# Patient Record
Sex: Female | Born: 1955 | Race: White | Hispanic: No | Marital: Married | Smoking: Current every day smoker
Health system: Southern US, Community
[De-identification: ages and names within clinical notes are randomized; demographics above are authoritative.]

## PROBLEM LIST (undated history)

## (undated) DIAGNOSIS — J45909 Unspecified asthma, uncomplicated: Secondary | ICD-10-CM

## (undated) DIAGNOSIS — J449 Chronic obstructive pulmonary disease, unspecified: Secondary | ICD-10-CM

## (undated) DIAGNOSIS — M797 Fibromyalgia: Secondary | ICD-10-CM

## (undated) DIAGNOSIS — M1990 Unspecified osteoarthritis, unspecified site: Secondary | ICD-10-CM

## (undated) DIAGNOSIS — IMO0001 Reserved for inherently not codable concepts without codable children: Secondary | ICD-10-CM

## (undated) DIAGNOSIS — J189 Pneumonia, unspecified organism: Secondary | ICD-10-CM

## (undated) HISTORY — PX: ABDOMINAL HYSTERECTOMY: SHX81

## (undated) HISTORY — PX: OTHER SURGICAL HISTORY: SHX169

## (undated) HISTORY — PX: CHOLECYSTECTOMY: SHX55

## (undated) HISTORY — PX: APPENDECTOMY: SHX54

---

## 2007-03-08 ENCOUNTER — Inpatient Hospital Stay (HOSPITAL_COMMUNITY): Admission: RE | Admit: 2007-03-08 | Discharge: 2007-03-11 | Payer: Self-pay | Admitting: Neurosurgery

## 2010-12-06 NOTE — Op Note (Signed)
NAMEKARIGAN, CLONINGER                ACCOUNT NO.:  [id number]   MEDICAL RECORD NO.:  [id number]          PATIENT TYPE:  INP   LOCATION:  3172                         FACILITY:  [facility]   PHYSICIAN:  Coletta Memos, M.D.     DATE OF BIRTH:  [birth year]   DATE OF PROCEDURE:  03/08/2007  DATE OF DISCHARGE:                               OPERATIVE REPORT   PREOPERATIVE DIAGNOSES:  1. Lumbar stenosis L4-5.  2. Lumbar anterolisthesis L4 and L5.  3. Lumbar radiculopathy.   POSTOPERATIVE DIAGNOSES:  1. Lumbar stenosis L4-5.  2. Lumbar anterolisthesis L4 and L5.  3. Lumbar radiculopathy.   PROCEDURE:  1. Posterior lumbar interbody arthrodesis L4-5.  2. Decompression L4-L5 nerve roots.  3. Pedicle screw fixation L4-5.   SURGEON:  Coletta Memos, M.D.   ASSISTANT:  Clydene Fake, M.D.   INDICATIONS:  Stephanie Chen is a 55 year old who presents with severe  pain in the back and lower extremities, especially on the left side.  She underwent epidural injections without success.  Secondary to the  listhesis, facette arthropathy and stenosis, I felt that after I removed  enough bone to make a difference that she would need a fusion.  She is  admitted today for that procedure.   OPERATIVE NOTE:  Ms. Tamura was brought to the operating room.  She had  a Foley catheter placed under sterile conditions after a general  anesthetic was instituted.  She was intubated prior to that without  difficulty.  She was rolled prone onto a Jackson table, and all pressure  points were properly padded.  Her back was prepped, and she was draped  in a sterile fashion.  I infiltrated 30 mL 0.5% lidocaine 1:200,000  strength epinephrine into the lumbar region.  I opened the skin with a  #10 blade, and I took this down to the dorsal thoracolumbar fascia.  I  then exposed the lamina of L3, L4 and L5 bilaterally.  X-ray showed that  I was in the correct interlaminar position.  I placed self-retaining  retractors and then  proceeded with the decompression.   I decompressed the L4 and L5 nerve roots by doing a laminectomy of L4.  She had a great deal of redundant ligamentum flavum and was quite tight  at this level.  I removed the ligament and decompressed using Kerrison  punch bilaterally.  I also used a high-speed drill to perform the  laminectomy of L4.  After thoroughly decompressing both L4 and L5 nerve  roots, I turned my attention towards the interbody arthrodesis.   I opened the disk space bilaterally using a #15 blade.  I then proceeded  with the diskectomy and to prepare the endplates for arthrodesis at that  level using a number of cutting tools, curettes, Kerrison punches and  pituitary rongeurs.  After preparing the disk space, I then sized two 13-  mm cages.  I packed those with morselized autograft and then placed  these into the disk space, one on the left and one on the right side  without difficulty.  I then proceeded to the pedicle  screw fixation.  I  with fluoroscopic guidance placed four screws, two in L4, two in L5,  first using a pedicle probe, then a tap, then the screws.  Each entry  and exit from the hole I checked to make sure that there were no  cutouts, and I appreciated none.  X-ray showed the screws were in good  position.  After that, I then placed the rods and locking caps without  difficulty.   I placed more bone into the disk space around the cages.   I then closed with the wound with Dr. Doreen Beam assistance using Vicryl  sutures to reapproximate the thoracolumbar fascia, subcutaneous  subcuticular layers.  Dermabond was used for sterile dressing.           ______________________________  Coletta Memos, M.D.     KC/MEDQ  D:  03/08/2007  T:  03/10/2007  Job:  [id number]

## 2010-12-09 NOTE — Discharge Summary (Signed)
Stephanie Chen, HAPP NO.:  [id number]   MEDICAL RECORD NO.:  [id number]          PATIENT TYPE:  INP   LOCATION:  5155                         FACILITY:  [facility]   PHYSICIAN:  Clydene Fake, M.D.  DATE OF BIRTH:  21-Aug-1955   DATE OF ADMISSION:  03/08/2007  DATE OF DISCHARGE:  03/11/2007                               DISCHARGE SUMMARY   DIAGNOSES:  1. Lumbar stenosis  2. Central listhesis  3. Radiculopathy.   DISCHARGE DIAGNOSES:  1. Lumbar stenosis.  2. Central listhesis  3. Radiculopathy.   PROCEDURE:  Posterior lumbar decompression and fusion with PLIF with  interbody cages with pedicle screw excision.   REASON FOR ADMISSION:  The patient is a 55 year old woman with severe  pain to her back and lower extremities worse on the left.  Work up  showed central listhesis and stenosis.  Epidural injections did not give  lasting relief and the patient is admitted for decompression and fusion.   HOSPITAL COURSE:  The patient admitted on the day of surgery and  underwent procedure without complications.  Postoperatively the patient  is transferred to the recovery room and then to the floor.  The first  day she was 16th up with much less leg pain.  The incision was clean,  dry and intact.  She was doing well in her increased activity.  She  continued making progress the following day.  She was up ambulating.  We  discontinued the Foley and switched to p.o. pain medicine.  She  continued making good progress with much less leg pain.  She was  ambulating well and was discharged home in stable condition on March 11, 2007.  She was sent home with her usual home medicines plus Percocet  and Flexeril on a p.r.n. basis.  Activity:  No stress activity.  No  special incision care needed, just observe and followup with Dr. Franky Macho  in 2 to 3 weeks.           ______________________________  Clydene Fake, M.D.     JRH/MEDQ  D:  04/11/2007  T:  04/11/2007  Job:   [id number]

## 2011-05-08 LAB — BASIC METABOLIC PANEL
CO2: 30
Chloride: 107
GFR calc Af Amer: >60
Glucose, Bld: 97
Potassium: 4.1
Sodium: 143

## 2011-05-08 LAB — CBC
HCT: 38
Hemoglobin: 12.9
MCHC: 33.8
MCV: 96.3
RBC: 3.95
RDW: 13.4

## 2011-05-08 LAB — TYPE AND SCREEN: Antibody Screen: NEGATIVE

## 2014-05-28 ENCOUNTER — Other Ambulatory Visit (HOSPITAL_COMMUNITY): Payer: Self-pay | Admitting: Neurosurgery

## 2014-06-05 ENCOUNTER — Encounter (HOSPITAL_COMMUNITY)
Admission: RE | Admit: 2014-06-05 | Discharge: 2014-06-05 | Disposition: A | Discharge status: Home / Self Care | Payer: Commercial Managed Care - PPO | Source: Ambulatory Visit | Attending: Neurosurgery | Admitting: Neurosurgery

## 2014-06-05 ENCOUNTER — Encounter (HOSPITAL_COMMUNITY): Payer: Self-pay

## 2014-06-05 DIAGNOSIS — M47816 Spondylosis without myelopathy or radiculopathy, lumbar region: Secondary | ICD-10-CM | POA: Insufficient documentation

## 2014-06-05 DIAGNOSIS — Z01812 Encounter for preprocedural laboratory examination: Secondary | ICD-10-CM | POA: Insufficient documentation

## 2014-06-05 HISTORY — DX: Fibromyalgia: M79.7

## 2014-06-05 HISTORY — DX: Unspecified asthma, uncomplicated: J45.909

## 2014-06-05 HISTORY — DX: Reserved for inherently not codable concepts without codable children: IMO0001

## 2014-06-05 HISTORY — DX: Unspecified osteoarthritis, unspecified site: M19.90

## 2014-06-05 HISTORY — DX: Pneumonia, unspecified organism: J18.9

## 2014-06-05 HISTORY — DX: Chronic obstructive pulmonary disease, unspecified: J44.9

## 2014-06-05 LAB — CBC
HEMATOCRIT: 42.2 % (ref 36.0–46.0)
Hemoglobin: 14.1 g/dL (ref 12.0–15.0)
MCH: 33.7 pg (ref 26.0–34.0)
MCHC: 33.4 g/dL (ref 30.0–36.0)
MCV: 100.7 fL — AB (ref 78.0–100.0)
PLATELETS: 200 10*3/uL (ref 150–400)
RBC: 4.19 MIL/uL (ref 3.87–5.11)
RDW: 12.4 % (ref 11.5–15.5)
WBC: 7.3 10*3/uL (ref 4.0–10.5)

## 2014-06-05 LAB — COMPREHENSIVE METABOLIC PANEL
ALT: 23 U/L (ref 0–35)
AST: 21 U/L (ref 0–37)
Albumin: 3.5 g/dL (ref 3.5–5.2)
Alkaline Phosphatase: 90 U/L (ref 39–117)
Anion gap: 11 (ref 5–15)
BUN: 15 mg/dL (ref 6–23)
CALCIUM: 9.1 mg/dL (ref 8.4–10.5)
CHLORIDE: 104 meq/L (ref 96–112)
CO2: 27 meq/L (ref 19–32)
CREATININE: 0.85 mg/dL (ref 0.50–1.10)
GFR, EST AFRICAN AMERICAN: 85 mL/min — AB (ref >90)
GFR, EST NON AFRICAN AMERICAN: 74 mL/min — AB (ref >90)
GLUCOSE: 120 mg/dL — AB (ref 70–99)
Potassium: 4.4 mEq/L (ref 3.7–5.3)
SODIUM: 142 meq/L (ref 137–147)
Total Protein: 6.8 g/dL (ref 6.0–8.3)

## 2014-06-05 LAB — SURGICAL PCR SCREEN
MRSA, PCR: NEGATIVE
Staphylococcus aureus: NEGATIVE

## 2014-06-05 LAB — TYPE AND SCREEN
ABO/RH(D): B POS
Antibody Screen: NEGATIVE

## 2014-06-05 NOTE — Progress Notes (Signed)
PCP is Dr Sudie BaileyProchnau States that she had an echo and stress test years ago maybe in 2001, but doesn't remember the cardiologist. Requested EKG from Dr Donita BrooksProchnau's office. Pt [facility]. Request sent for Anes notes and CXR

## 2014-06-05 NOTE — Pre-Procedure Instructions (Signed)
Stephanie SkeenSusan M Curran  06/05/2014   Your procedure is scheduled on:  Nov 20 at 3pm  [facility] at 1pm  Call this number if you have problems the morning of surgery: [phone]   Remember:   Do not eat food or drink liquids after midnight.   Take these medicines the morning of surgery with A SIP OF WATER: Bupropion (wellbutrin), cetirizine (zyrtec) if needed, Diazepam (Valium) if needed, oxycodone(oxycontin) , Duloxetine (cymbalta), Pregabalin (lyrica), ranitidine (zantac), Oxycodone-acetaminophen (percocet) if needed,  Umeclidinium-Vilanterol (Anoro ellipta In), and albuterol(proventil) bring inhalers with you the morning of surgery  Stop taking aspirin, ibuprofen, Herbal medications, BC's, Goody's, Fish Oil     Do not wear jewelry, make-up or nail polish.  Do not wear lotions, powders, or perfumes. You may wear deodorant.  Do not shave 48 hours prior to surgery. Men may shave face and neck.  Do not bring valuables to the hospital.  [facility] is not responsible                  for any belongings or valuables.               Contacts, dentures or bridgework may not be worn into surgery.  Leave suitcase in the car. After surgery it may be brought to your room.  For patients admitted to the hospital, discharge time is determined by your                treatment team.               Patients discharged the day of surgery will not be allowed to drive  home.    Special Instructions: [place] - Preparing for Surgery  Before surgery, you can play an important role.  Because skin is not sterile, your skin needs to be as free of germs as possible.  You can reduce the number of germs on you skin by washing with CHG (chlorahexidine gluconate) soap before surgery.  CHG is an antiseptic cleaner which kills germs and bonds with the skin to continue killing germs even after washing.  Please DO NOT use if you have an allergy to CHG or antibacterial soaps.  If your skin  becomes reddened/irritated stop using the CHG and inform your nurse when you arrive at Short Stay.  Do not shave (including legs and underarms) for at least 48 hours prior to the first CHG shower.  You may shave your face.  Please follow these instructions carefully:   1.  Shower with CHG Soap the night before surgery and the                                morning of Surgery.  2.  If you choose to wash your hair, wash your hair first as usual with your       normal shampoo.  3.  After you shampoo, rinse your hair and body thoroughly to remove the                      Shampoo.  4.  Use CHG as you would any other liquid soap.  You can apply chg directly       to the skin and wash gently with scrungie or a clean washcloth.  5.  Apply the CHG Soap to your body ONLY FROM THE NECK DOWN.  Do not use on open wounds or open sores.  Avoid contact with your eyes,       ears, mouth and genitals (private parts).  Wash genitals (private parts)       with your normal soap.  6.  Wash thoroughly, paying special attention to the area where your surgery        will be performed.  7.  Thoroughly rinse your body with warm water from the neck down.  8.  DO NOT shower/wash with your normal soap after using and rinsing off       the CHG Soap.  9.  Pat yourself dry with a clean towel.            10.  Wear clean pajamas.            11.  Place clean sheets on your bed the night of your first shower and do not        sleep with pets.  Day of Surgery  Do not apply any lotions/deoderants the morning of surgery.  Please wear clean clothes to the hospital/surgery center.      Please read over the following fact sheets that you were given: Pain Booklet, Coughing and Deep Breathing, Blood Transfusion Information, MRSA Information and Surgical Site Infection Prevention

## 2014-06-11 MED ORDER — CEFAZOLIN SODIUM-DEXTROSE 2-3 GM-% IV SOLR
2.0000 g | INTRAVENOUS | Status: AC
  Administered 2014-06-12 (×2): 2 g via INTRAVENOUS
  Filled 2014-06-11: qty 50

## 2014-06-12 ENCOUNTER — Encounter (HOSPITAL_COMMUNITY): Payer: Self-pay | Admitting: *Deleted

## 2014-06-12 ENCOUNTER — Inpatient Hospital Stay (HOSPITAL_COMMUNITY): Payer: Commercial Managed Care - PPO

## 2014-06-12 ENCOUNTER — Encounter (HOSPITAL_COMMUNITY)
Admission: RE | Disposition: A | Discharge status: Home Health | Payer: Self-pay | Source: Ambulatory Visit | Attending: Neurosurgery

## 2014-06-12 ENCOUNTER — Inpatient Hospital Stay (HOSPITAL_COMMUNITY): Payer: Commercial Managed Care - PPO | Admitting: Anesthesiology

## 2014-06-12 ENCOUNTER — Inpatient Hospital Stay (HOSPITAL_COMMUNITY): Payer: Commercial Managed Care - PPO | Admitting: Vascular Surgery

## 2014-06-12 ENCOUNTER — Inpatient Hospital Stay (HOSPITAL_COMMUNITY)
Admission: RE | Admit: 2014-06-12 | Discharge: 2014-06-16 | DRG: 460 | Disposition: A | Discharge status: Home Health | Payer: Commercial Managed Care - PPO | Source: Ambulatory Visit | Attending: Neurosurgery | Admitting: Neurosurgery

## 2014-06-12 DIAGNOSIS — M4316 Spondylolisthesis, lumbar region: Secondary | ICD-10-CM | POA: Diagnosis present

## 2014-06-12 DIAGNOSIS — Z419 Encounter for procedure for purposes other than remedying health state, unspecified: Secondary | ICD-10-CM

## 2014-06-12 DIAGNOSIS — M4806 Spinal stenosis, lumbar region: Secondary | ICD-10-CM | POA: Diagnosis present

## 2014-06-12 DIAGNOSIS — F17210 Nicotine dependence, cigarettes, uncomplicated: Secondary | ICD-10-CM | POA: Diagnosis present

## 2014-06-12 DIAGNOSIS — F329 Major depressive disorder, single episode, unspecified: Secondary | ICD-10-CM | POA: Diagnosis present

## 2014-06-12 DIAGNOSIS — M549 Dorsalgia, unspecified: Secondary | ICD-10-CM | POA: Diagnosis present

## 2014-06-12 DIAGNOSIS — Z6841 Body Mass Index (BMI) 40.0 and over, adult: Secondary | ICD-10-CM

## 2014-06-12 DIAGNOSIS — K219 Gastro-esophageal reflux disease without esophagitis: Secondary | ICD-10-CM | POA: Diagnosis present

## 2014-06-12 DIAGNOSIS — J449 Chronic obstructive pulmonary disease, unspecified: Secondary | ICD-10-CM | POA: Diagnosis present

## 2014-06-12 DIAGNOSIS — M5410 Radiculopathy, site unspecified: Secondary | ICD-10-CM

## 2014-06-12 SURGERY — POSTERIOR LUMBAR FUSION 1 LEVEL
Anesthesia: General | Site: Spine Lumbar

## 2014-06-12 MED ORDER — OXYCODONE-ACETAMINOPHEN 5-325 MG PO TABS
1.0000 | ORAL_TABLET | ORAL | Status: DC | PRN
  Administered 2014-06-14 – 2014-06-16 (×7): 2 via ORAL
  Filled 2014-06-12 (×7): qty 2

## 2014-06-12 MED ORDER — DIAZEPAM 5 MG PO TABS: 5.0000 mg | ORAL_TABLET | Freq: Three times a day (TID) | ORAL | Status: DC | PRN

## 2014-06-12 MED ORDER — SODIUM CHLORIDE 0.9 % IV SOLN: 250.0000 mL | INTRAVENOUS | Status: DC

## 2014-06-12 MED ORDER — ALBUTEROL SULFATE HFA 108 (90 BASE) MCG/ACT IN AERS: 2.0000 | INHALATION_SPRAY | Freq: Four times a day (QID) | RESPIRATORY_TRACT | Status: DC | PRN

## 2014-06-12 MED ORDER — OXYCODONE HCL ER 10 MG PO T12A
40.0000 mg | EXTENDED_RELEASE_TABLET | Freq: Two times a day (BID) | ORAL | Status: DC
  Administered 2014-06-12 – 2014-06-16 (×8): 40 mg via ORAL
  Filled 2014-06-12 (×8): qty 4

## 2014-06-12 MED ORDER — UMECLIDINIUM-VILANTEROL 62.5-25 MCG/INH IN AEPB
1.0000 | INHALATION_SPRAY | Freq: Every day | RESPIRATORY_TRACT | Status: DC
  Administered 2014-06-13 – 2014-06-15 (×3): 1 via RESPIRATORY_TRACT

## 2014-06-12 MED ORDER — PREGABALIN 75 MG PO CAPS
75.0000 mg | ORAL_CAPSULE | Freq: Two times a day (BID) | ORAL | Status: DC
  Administered 2014-06-12 – 2014-06-16 (×8): 75 mg via ORAL
  Filled 2014-06-12 (×8): qty 1

## 2014-06-12 MED ORDER — POLYETHYLENE GLYCOL 3350 17 G PO PACK
17.0000 g | PACK | Freq: Every day | ORAL | Status: DC | PRN
  Administered 2014-06-14: 17 g via ORAL
  Filled 2014-06-12: qty 1

## 2014-06-12 MED ORDER — FAMOTIDINE 20 MG PO TABS
20.0000 mg | ORAL_TABLET | Freq: Two times a day (BID) | ORAL | Status: DC
  Administered 2014-06-12 – 2014-06-16 (×8): 20 mg via ORAL
  Filled 2014-06-12 (×8): qty 1

## 2014-06-12 MED ORDER — PHENYLEPHRINE HCL 10 MG/ML IJ SOLN
10.0000 mg | INTRAVENOUS | Status: DC | PRN
  Administered 2014-06-12: 25 ug/min via INTRAVENOUS

## 2014-06-12 MED ORDER — PROPOFOL 10 MG/ML IV BOLUS
INTRAVENOUS | Status: AC
  Filled 2014-06-12: qty 20

## 2014-06-12 MED ORDER — OXYCODONE HCL 5 MG PO TABS: 5.0000 mg | ORAL_TABLET | Freq: Once | ORAL | Status: DC | PRN

## 2014-06-12 MED ORDER — FENTANYL CITRATE 0.05 MG/ML IJ SOLN
INTRAMUSCULAR | Status: AC
  Filled 2014-06-12: qty 5

## 2014-06-12 MED ORDER — PROPOFOL 10 MG/ML IV BOLUS
INTRAVENOUS | Status: DC | PRN
  Administered 2014-06-12: 100 mg via INTRAVENOUS

## 2014-06-12 MED ORDER — LIDOCAINE HCL (CARDIAC) 20 MG/ML IV SOLN
INTRAVENOUS | Status: AC
  Filled 2014-06-12: qty 5

## 2014-06-12 MED ORDER — HYDROMORPHONE HCL 1 MG/ML IJ SOLN
0.5000 mg | INTRAMUSCULAR | Status: DC | PRN
  Administered 2014-06-13 – 2014-06-16 (×10): 1 mg via INTRAVENOUS
  Filled 2014-06-12 (×13): qty 1

## 2014-06-12 MED ORDER — HYDROMORPHONE HCL 1 MG/ML IJ SOLN
INTRAMUSCULAR | Status: AC
  Administered 2014-06-12: 0.5 mg via INTRAVENOUS
  Filled 2014-06-12: qty 2

## 2014-06-12 MED ORDER — SODIUM CHLORIDE 0.9 % IJ SOLN: 3.0000 mL | INTRAMUSCULAR | Status: DC | PRN

## 2014-06-12 MED ORDER — ACETAMINOPHEN 325 MG PO TABS
650.0000 mg | ORAL_TABLET | ORAL | Status: DC | PRN
  Administered 2014-06-13 – 2014-06-14 (×2): 650 mg via ORAL
  Filled 2014-06-12 (×2): qty 2

## 2014-06-12 MED ORDER — FENTANYL CITRATE 0.05 MG/ML IJ SOLN
INTRAMUSCULAR | Status: DC | PRN
  Administered 2014-06-12: 150 ug via INTRAVENOUS
  Administered 2014-06-12 (×2): 50 ug via INTRAVENOUS

## 2014-06-12 MED ORDER — OXYCODONE HCL 5 MG/5ML PO SOLN: 5.0000 mg | Freq: Once | ORAL | Status: DC | PRN

## 2014-06-12 MED ORDER — SUFENTANIL CITRATE 50 MCG/ML IV SOLN
INTRAVENOUS | Status: AC
  Filled 2014-06-12: qty 1

## 2014-06-12 MED ORDER — ROCURONIUM BROMIDE 50 MG/5ML IV SOLN
INTRAVENOUS | Status: AC
  Filled 2014-06-12: qty 1

## 2014-06-12 MED ORDER — SUFENTANIL CITRATE 50 MCG/ML IV SOLN
INTRAVENOUS | Status: DC | PRN
  Administered 2014-06-12: 25 ug via INTRAVENOUS
  Administered 2014-06-12: 20 ug via INTRAVENOUS
  Administered 2014-06-12: 5 ug via INTRAVENOUS

## 2014-06-12 MED ORDER — LORATADINE 10 MG PO TABS
10.0000 mg | ORAL_TABLET | Freq: Every day | ORAL | Status: DC
  Administered 2014-06-13 – 2014-06-16 (×4): 10 mg via ORAL
  Filled 2014-06-12 (×4): qty 1

## 2014-06-12 MED ORDER — SODIUM CHLORIDE 0.9 % IJ SOLN
3.0000 mL | Freq: Two times a day (BID) | INTRAMUSCULAR | Status: DC
  Administered 2014-06-13 – 2014-06-15 (×5): 3 mL via INTRAVENOUS

## 2014-06-12 MED ORDER — 0.9 % SODIUM CHLORIDE (POUR BTL) OPTIME
TOPICAL | Status: DC | PRN
  Administered 2014-06-12: 1000 mL

## 2014-06-12 MED ORDER — ALBUTEROL SULFATE (2.5 MG/3ML) 0.083% IN NEBU
2.5000 mg | INHALATION_SOLUTION | Freq: Four times a day (QID) | RESPIRATORY_TRACT | Status: DC | PRN
  Administered 2014-06-13 – 2014-06-14 (×2): 2.5 mg via RESPIRATORY_TRACT
  Filled 2014-06-12 (×2): qty 3

## 2014-06-12 MED ORDER — GLYCOPYRROLATE 0.2 MG/ML IJ SOLN
INTRAMUSCULAR | Status: AC
  Filled 2014-06-12: qty 1

## 2014-06-12 MED ORDER — ROCURONIUM BROMIDE 100 MG/10ML IV SOLN
INTRAVENOUS | Status: DC | PRN
  Administered 2014-06-12 (×3): 10 mg via INTRAVENOUS
  Administered 2014-06-12: 40 mg via INTRAVENOUS
  Administered 2014-06-12: 10 mg via INTRAVENOUS
  Administered 2014-06-12: 20 mg via INTRAVENOUS

## 2014-06-12 MED ORDER — KETAMINE HCL 100 MG/ML IJ SOLN
INTRAMUSCULAR | Status: AC
  Administered 2014-06-12 (×4): 10 mg via INTRAVENOUS
  Administered 2014-06-12: 50 mg via INTRAVENOUS
  Administered 2014-06-12: 10 mg via INTRAVENOUS
  Filled 2014-06-12: qty 1

## 2014-06-12 MED ORDER — MENTHOL 3 MG MT LOZG: 1.0000 | LOZENGE | OROMUCOSAL | Status: DC | PRN

## 2014-06-12 MED ORDER — ONDANSETRON HCL 4 MG/2ML IJ SOLN
INTRAMUSCULAR | Status: AC
  Filled 2014-06-12: qty 2

## 2014-06-12 MED ORDER — CEFAZOLIN SODIUM 1-5 GM-% IV SOLN
1.0000 g | Freq: Three times a day (TID) | INTRAVENOUS | Status: AC
  Administered 2014-06-13 (×2): 1 g via INTRAVENOUS
  Filled 2014-06-12 (×2): qty 50

## 2014-06-12 MED ORDER — ACETAMINOPHEN 10 MG/ML IV SOLN
INTRAVENOUS | Status: AC
  Administered 2014-06-12: 1000 mg via INTRAVENOUS
  Filled 2014-06-12: qty 100

## 2014-06-12 MED ORDER — GLYCOPYRROLATE 0.2 MG/ML IJ SOLN
INTRAMUSCULAR | Status: DC | PRN
  Administered 2014-06-12: 0.2 mg via INTRAVENOUS

## 2014-06-12 MED ORDER — DIAZEPAM 5 MG PO TABS
5.0000 mg | ORAL_TABLET | Freq: Four times a day (QID) | ORAL | Status: DC | PRN
  Administered 2014-06-13 – 2014-06-16 (×5): 5 mg via ORAL
  Filled 2014-06-12 (×5): qty 1

## 2014-06-12 MED ORDER — NEOSTIGMINE METHYLSULFATE 10 MG/10ML IV SOLN
INTRAVENOUS | Status: DC | PRN
  Administered 2014-06-12: 2 mg via INTRAVENOUS

## 2014-06-12 MED ORDER — ACETAMINOPHEN 650 MG RE SUPP: 650.0000 mg | RECTAL | Status: DC | PRN

## 2014-06-12 MED ORDER — PHENOL 1.4 % MT LIQD: 1.0000 | OROMUCOSAL | Status: DC | PRN

## 2014-06-12 MED ORDER — SENNA 8.6 MG PO TABS
1.0000 | ORAL_TABLET | Freq: Two times a day (BID) | ORAL | Status: DC
  Administered 2014-06-12 – 2014-06-16 (×6): 8.6 mg via ORAL
  Filled 2014-06-12 (×7): qty 1

## 2014-06-12 MED ORDER — LACTATED RINGERS IV SOLN
INTRAVENOUS | Status: DC
  Administered 2014-06-12: 15:00:00 via INTRAVENOUS

## 2014-06-12 MED ORDER — MIDAZOLAM HCL 2 MG/2ML IJ SOLN
INTRAMUSCULAR | Status: AC
  Filled 2014-06-12: qty 2

## 2014-06-12 MED ORDER — EPHEDRINE SULFATE 50 MG/ML IJ SOLN
INTRAMUSCULAR | Status: DC | PRN
  Administered 2014-06-12: 15 mg via INTRAVENOUS
  Administered 2014-06-12: 10 mg via INTRAVENOUS

## 2014-06-12 MED ORDER — LACTATED RINGERS IV SOLN
INTRAVENOUS | Status: DC | PRN
  Administered 2014-06-12 (×3): via INTRAVENOUS

## 2014-06-12 MED ORDER — BUPROPION HCL ER (XL) 150 MG PO TB24
300.0000 mg | ORAL_TABLET | Freq: Every day | ORAL | Status: DC
  Administered 2014-06-13 – 2014-06-16 (×4): 300 mg via ORAL
  Filled 2014-06-12 (×4): qty 2

## 2014-06-12 MED ORDER — ONDANSETRON HCL 4 MG/2ML IJ SOLN
INTRAMUSCULAR | Status: DC | PRN
  Administered 2014-06-12: 4 mg via INTRAVENOUS

## 2014-06-12 MED ORDER — THROMBIN 20000 UNITS EX SOLR
CUTANEOUS | Status: DC | PRN
  Administered 2014-06-12: 18:00:00 via TOPICAL

## 2014-06-12 MED ORDER — LIDOCAINE-EPINEPHRINE 0.5 %-1:200000 IJ SOLN
INTRAMUSCULAR | Status: DC | PRN
  Administered 2014-06-12: 10 mL

## 2014-06-12 MED ORDER — HYDROMORPHONE HCL 1 MG/ML IJ SOLN
0.2500 mg | INTRAMUSCULAR | Status: DC | PRN
  Administered 2014-06-12 (×4): 0.5 mg via INTRAVENOUS

## 2014-06-12 MED ORDER — ONDANSETRON HCL 4 MG/2ML IJ SOLN: 4.0000 mg | INTRAMUSCULAR | Status: DC | PRN

## 2014-06-12 MED ORDER — POTASSIUM CHLORIDE IN NACL 20-0.9 MEQ/L-% IV SOLN
INTRAVENOUS | Status: DC
  Administered 2014-06-12: 1000 mL via INTRAVENOUS
  Administered 2014-06-13: 14:00:00 via INTRAVENOUS
  Filled 2014-06-12 (×2): qty 1000

## 2014-06-12 MED ORDER — CEFAZOLIN SODIUM-DEXTROSE 2-3 GM-% IV SOLR
INTRAVENOUS | Status: AC
  Filled 2014-06-12: qty 50

## 2014-06-12 MED ORDER — ARTIFICIAL TEARS OP OINT
TOPICAL_OINTMENT | OPHTHALMIC | Status: DC | PRN
  Administered 2014-06-12: 1 via OPHTHALMIC

## 2014-06-12 MED ORDER — SODIUM CHLORIDE 0.9 % IJ SOLN
INTRAMUSCULAR | Status: AC
  Filled 2014-06-12: qty 10

## 2014-06-12 MED ORDER — DULOXETINE HCL 60 MG PO CPEP
60.0000 mg | ORAL_CAPSULE | Freq: Every day | ORAL | Status: DC
  Administered 2014-06-13 – 2014-06-16 (×4): 60 mg via ORAL
  Filled 2014-06-12 (×4): qty 1

## 2014-06-12 SURGICAL SUPPLY — 68 items
BENZOIN TINCTURE PRP APPL 2/3 (GAUZE/BANDAGES/DRESSINGS) IMPLANT
BLADE CLIPPER SURG (BLADE) IMPLANT
BUR MATCHSTICK NEURO 3.0 LAGG (BURR) ×2 IMPLANT
BUR PRECISION FLUTE 5.0 (BURR) ×2 IMPLANT
CAGE CAPSTONE 11X22X0 SPINAL (Cage) ×4 IMPLANT
CANISTER SUCT 3000ML (MISCELLANEOUS) ×2 IMPLANT
CONT SPEC 4OZ CLIKSEAL STRL BL (MISCELLANEOUS) ×2 IMPLANT
COVER BACK TABLE 60X90IN (DRAPES) ×2 IMPLANT
DECANTER SPIKE VIAL GLASS SM (MISCELLANEOUS) ×2 IMPLANT
DRAPE C-ARM 42X72 X-RAY (DRAPES) ×4 IMPLANT
DRAPE C-ARMOR (DRAPES) ×2 IMPLANT
DRAPE LAPAROTOMY 100X72X124 (DRAPES) ×2 IMPLANT
DRAPE POUCH INSTRU U-SHP 10X18 (DRAPES) ×2 IMPLANT
DRAPE SURG 17X23 STRL (DRAPES) ×2 IMPLANT
DRSG OPSITE POSTOP 4X8 (GAUZE/BANDAGES/DRESSINGS) ×2 IMPLANT
DRSG TELFA 3X8 NADH (GAUZE/BANDAGES/DRESSINGS) IMPLANT
DURAPREP 26ML APPLICATOR (WOUND CARE) ×2 IMPLANT
ELECT BLADE 4.0 EZ CLEAN MEGAD (MISCELLANEOUS) ×2
ELECT REM PT RETURN 9FT ADLT (ELECTROSURGICAL) ×2
ELECTRODE BLDE 4.0 EZ CLN MEGD (MISCELLANEOUS) ×1 IMPLANT
ELECTRODE REM PT RTRN 9FT ADLT (ELECTROSURGICAL) ×1 IMPLANT
GAUZE SPONGE 4X4 12PLY STRL (GAUZE/BANDAGES/DRESSINGS) IMPLANT
GAUZE SPONGE 4X4 16PLY XRAY LF (GAUZE/BANDAGES/DRESSINGS) IMPLANT
GLOVE BIOGEL PI IND STRL 7.0 (GLOVE) ×1 IMPLANT
GLOVE BIOGEL PI IND STRL 7.5 (GLOVE) ×1 IMPLANT
GLOVE BIOGEL PI IND STRL 8.5 (GLOVE) ×1 IMPLANT
GLOVE BIOGEL PI INDICATOR 7.0 (GLOVE) ×1
GLOVE BIOGEL PI INDICATOR 7.5 (GLOVE) ×1
GLOVE BIOGEL PI INDICATOR 8.5 (GLOVE) ×1
GLOVE ECLIPSE 6.5 STRL STRAW (GLOVE) ×6 IMPLANT
GLOVE ECLIPSE 7.5 STRL STRAW (GLOVE) ×4 IMPLANT
GLOVE ECLIPSE 8.5 STRL (GLOVE) ×2 IMPLANT
GLOVE EXAM NITRILE LRG STRL (GLOVE) IMPLANT
GLOVE EXAM NITRILE MD LF STRL (GLOVE) IMPLANT
GLOVE EXAM NITRILE XL STR (GLOVE) IMPLANT
GLOVE EXAM NITRILE XS STR PU (GLOVE) IMPLANT
GLOVE SS BIOGEL STRL SZ 6.5 (GLOVE) ×3 IMPLANT
GLOVE SUPERSENSE BIOGEL SZ 6.5 (GLOVE) ×3
GOWN STRL REUS W/ TWL LRG LVL3 (GOWN DISPOSABLE) ×2 IMPLANT
GOWN STRL REUS W/ TWL XL LVL3 (GOWN DISPOSABLE) ×1 IMPLANT
GOWN STRL REUS W/TWL 2XL LVL3 (GOWN DISPOSABLE) ×2 IMPLANT
GOWN STRL REUS W/TWL LRG LVL3 (GOWN DISPOSABLE) ×2
GOWN STRL REUS W/TWL XL LVL3 (GOWN DISPOSABLE) ×1
KIT BASIN OR (CUSTOM PROCEDURE TRAY) ×2 IMPLANT
KIT POSITION SURG JACKSON T1 (MISCELLANEOUS) ×2 IMPLANT
KIT ROOM TURNOVER OR (KITS) ×2 IMPLANT
LIQUID BAND (GAUZE/BANDAGES/DRESSINGS) ×2 IMPLANT
NEEDLE HYPO 25X1 1.5 SAFETY (NEEDLE) ×2 IMPLANT
NEEDLE SPNL 18GX3.5 QUINCKE PK (NEEDLE) ×2 IMPLANT
NS IRRIG 1000ML POUR BTL (IV SOLUTION) ×2 IMPLANT
PACK LAMINECTOMY NEURO (CUSTOM PROCEDURE TRAY) ×2 IMPLANT
PAD ARMBOARD 7.5X6 YLW CONV (MISCELLANEOUS) ×4 IMPLANT
ROD PREBENT 6.35X50 (Rod) ×4 IMPLANT
SCREW PEDICLE VA L635 6.5X45M (Screw) ×4 IMPLANT
SCREW SET BREAK OFF (Screw) ×12 IMPLANT
SPONGE LAP 4X18 X RAY DECT (DISPOSABLE) IMPLANT
SPONGE SURGIFOAM ABS GEL 100 (HEMOSTASIS) ×2 IMPLANT
STRIP CLOSURE SKIN 1/2X4 (GAUZE/BANDAGES/DRESSINGS) IMPLANT
SUT PROLENE 6 0 BV (SUTURE) IMPLANT
SUT VIC AB 0 CT1 18XCR BRD8 (SUTURE) ×2 IMPLANT
SUT VIC AB 0 CT1 8-18 (SUTURE) ×2
SUT VIC AB 2-0 CT1 18 (SUTURE) ×4 IMPLANT
SUT VIC AB 3-0 SH 8-18 (SUTURE) ×4 IMPLANT
SYR 20ML ECCENTRIC (SYRINGE) ×2 IMPLANT
TOWEL OR 17X24 6PK STRL BLUE (TOWEL DISPOSABLE) ×2 IMPLANT
TOWEL OR 17X26 10 PK STRL BLUE (TOWEL DISPOSABLE) ×2 IMPLANT
TRAY FOLEY CATH 14FRSI W/METER (CATHETERS) ×2 IMPLANT
WATER STERILE IRR 1000ML POUR (IV SOLUTION) ×2 IMPLANT

## 2014-06-12 NOTE — Anesthesia Procedure Notes (Signed)
Procedure Name: Intubation Date/Time: 06/12/2014 4:33 PM Performed by: Coralee RudFLORES, Torrell Krutz Pre-anesthesia Checklist: Patient identified, Emergency Drugs available, Suction available and Patient being monitored Patient Re-evaluated:Patient Re-evaluated prior to inductionOxygen Delivery Method: Circle system utilized Preoxygenation: Pre-oxygenation with 100% oxygen Intubation Type: IV induction Ventilation: Mask ventilation without difficulty and Oral airway inserted - appropriate to patient size Laryngoscope Size: Miller and 2 Grade View: Grade I Tube type: Oral Tube size: 7.5 mm Number of attempts: 1 Airway Equipment and Method: Stylet and Bite block Placement Confirmation: ETT inserted through vocal cords under direct vision,  positive ETCO2 and breath sounds checked- equal and bilateral Secured at: 23 cm Dental Injury: Teeth and Oropharynx as per pre-operative assessment

## 2014-06-12 NOTE — Anesthesia Postprocedure Evaluation (Signed)
  Anesthesia Post-op Note  Patient: Stephanie SkeenSusan M Chen  Procedure(s) Performed: Procedure(s): Lumbar three-four posterior lumbar interbody fusion with interbody prosthesis posterior lateral arthrodesis and posterior nonsegmental instrumentation (N/A)  Patient Location: PACU  Anesthesia Type:General  Level of Consciousness: awake, alert  and oriented  Airway and Oxygen Therapy: Patient Spontanous Breathing and Patient connected to nasal cannula oxygen  Post-op Pain: mild  Post-op Assessment: Post-op Vital signs reviewed, Patient's Cardiovascular Status Stable, Respiratory Function Stable, Patent Airway and Pain level controlled  Post-op Vital Signs: stable  Last Vitals:  Filed Vitals:   06/12/14 2241  BP: 128/46  Pulse: 73  Temp: 36.8 C  Resp: 15    Complications: No apparent anesthesia complications

## 2014-06-12 NOTE — Op Note (Addendum)
06/12/2014  9:31 PM  PATIENT:  Stephanie Chen  58 y.o. 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  PROCEDURE:  Procedure(s): Lumbar three-four posterior lumbar interbody fusion with interbody prosthesis   Lumbar 3, Lumbar 4 nerve root decompression beyond the exposure necessary for a Plif  posterior segmental instrumentation L3-5, medtronic Legacy hardware Placement of L3 screws bilaterally Removal L4 screw left side  SURGEON:  Surgeon(s): Coletta MemosKyle Shanay Woolman, MD Barnett AbuHenry Elsner, MD  ASSISTANTS:Elsner, Sherilyn CooterHenry  ANESTHESIA:   general  EBL:  Total I/O In: 500 [I.V.:500] Out: 145 [Urine:45; Blood:100]  BLOOD ADMINISTERED:none  CELL SAVER GIVEN:none  COUNT:per nursing  DRAINS: none   SPECIMEN:  No Specimen  DICTATION: Stephanie Chen is a 58 y.o. female whom was taken to the operating room intubated, and placed under a general anesthetic without difficulty. A foley catheter was placed under sterile conditions. She was positioned prone on a Jackson stable with all pressure points properly padded.  Her lumbar region was prepped and draped in a sterile manner. I infiltrated 10cc's 1/2%lidocaine/1:2000,000 strength epinephrine into the planned incision, which encompassed the superior portion of the previous incision, and superior to the incision. I opened the skin with a 10 blade and took the incision down to the thoracolumbar fascia. I exposed the lamina of L2,3, and 4 in a subperiosteal fashion bilaterally. I confirmed my location with an intraoperative xray.  I placed self retaining retractors and started the decompression.  I decompressed the spinal canal and the L3, and L4 nerve roots. I performed a complete laminectomy of L3, and removed the rostral portion of what was left of L4. The inferior arthropathic facets of L3 were removed in order to fully decompress the  lateral recesses and the neural foramina of L3/4. The canal was well decompressed, and the exposure was far lateral to what was needed just to place cages for the plif. I used the Leksell rongeur, the drill, and the Kerrison punches remove the bone for the decompression. PLIF's were performed at L3/4 in the same fashion. I opened the disc space with a 15 blade then used a variety of instruments to remove the disc and prepare the space for the arthrodesis. I used curettes, rongeurs, punches, shavers for the disc space, and rasps in the discetomy. I measured the disc space and placed 11mm x 22mm  Peek cages(medtronic) into the disc space(s).  Dr. Danielle DessElsner placed pedicle screws at L3, using fluoroscopic guidance. He drilled a pilot hole, then cannulated the pedicle with a bone probe at each site. He then tapped each pedicle, assessing each site for pedicle violations. No cutouts were appreciated. Screws (Medtronic Legacy) were then placed at each site without difficulty. Final films were performed and all screws appeared to be in good position. I removed the left L4 screw as its alignment made it very difficulty to place the rod. I placed the rods and secured them with locking caps on each side. We closed the wound in a layered fashion. I approximated the thoracolumbar fascia, subcutaneous, and subcuticular planes with vicryl sutures. I used dermabond and an occulsive bandage for a sterile dressing.     PLAN OF CARE: Admit to inpatient   PATIENT DISPOSITION:  PACU - hemodynamically stable.   Delay start of Pharmacological VTE agent (>24hrs) due to surgical blood loss or risk of bleeding:  yes

## 2014-06-12 NOTE — Transfer of Care (Signed)
Immediate Anesthesia Transfer of Care Note  Patient: Stephanie SkeenSusan M Debruyn  Procedure(s) Performed: Procedure(s): Lumbar three-four posterior lumbar interbody fusion with interbody prosthesis posterior lateral arthrodesis and posterior nonsegmental instrumentation (N/A)  Patient Location: PACU  Anesthesia Type:General  Level of Consciousness: awake, alert , oriented and patient cooperative  Airway & Oxygen Therapy: Patient Spontanous Breathing and Patient connected to nasal cannula oxygen  Post-op Assessment: Report given to PACU RN, Post -op Vital signs reviewed and stable, Patient moving all extremities and Patient moving all extremities X 4  Post vital signs: Reviewed and stable  Complications: No apparent anesthesia complications

## 2014-06-12 NOTE — Progress Notes (Signed)
Patient arrived to 4N16 from PACU in stable condition. Husband at bedside. Patient assessed and oriented to room and unit. Call bell within reach, will continue to monitor patient closely. Eliezer ChampagneShakenna Iza Preston,RN

## 2014-06-12 NOTE — Anesthesia Preprocedure Evaluation (Signed)
Anesthesia Evaluation  Patient identified by MRN, date of birth, ID band Patient awake    Reviewed: Allergy & Precautions, H&P , NPO status , Patient's Chart, lab work & pertinent test results  History of Anesthesia Complications Negative for: history of anesthetic complications  Airway Mallampati: II  TM Distance: >3 FB Neck ROM: Full    Dental  (+) Edentulous Upper, Edentulous Lower   Pulmonary shortness of breath and with exertion, neg sleep apnea, COPD COPD inhaler, neg recent URI, Current Smoker,  breath sounds clear to auscultation        Cardiovascular negative cardio ROS  Rhythm:Regular     Neuro/Psych Depression Low back pain with left leg symptoms  Neuromuscular disease    GI/Hepatic Neg liver ROS, GERD-  Medicated and Controlled,  Endo/Other  neg diabetesMorbid obesity  Renal/GU negative Renal ROS     Musculoskeletal  (+) Arthritis -, Fibromyalgia -, narcotic dependent  Abdominal   Peds  Hematology negative hematology ROS (+)   Anesthesia Other Findings   Reproductive/Obstetrics                             Anesthesia Physical Anesthesia Plan  ASA: III  Anesthesia Plan: General   Post-op Pain Management:    Induction: Intravenous  Airway Management Planned: Oral ETT  Additional Equipment: None  Intra-op Plan:   Post-operative Plan: Extubation in OR  Informed Consent: I have reviewed the patients History and Physical, chart, labs and discussed the procedure including the risks, benefits and alternatives for the proposed anesthesia with the patient or authorized representative who has indicated his/her understanding and acceptance.   Dental advisory given  Plan Discussed with: CRNA and Surgeon  Anesthesia Plan Comments:         Anesthesia Quick Evaluation

## 2014-06-12 NOTE — H&P (Signed)
There were no vitals taken for this visit. HISTORY OF PRESENT ILLNESS: Ms. Fayrene FearingGarren is a patient of mine whom I took to the operating room in 2008 and she underwent an L4-5 fusion. She said she may have gotten a few months of relief, but nothing significant because both parents were sick and she had to dive right in and being their primary caretaker. Nonetheless, she has been able to deal with the pain over these past seven years, but she has been taking OxyContin at a dose of 40 mg twice a day and 10 mg Percocet for at least the last two years to deal with the pain that she has. She has great difficulty walking and she stated she is not able to go to [employer]. She is slowed by her pain. She continues to work and does not miss that, but as she said it is the best that ever is on Monday morning when she first wakes up. She is right-handed. She describes pain in the legs, back, and weakness in both legs. She said she has numbness and tingling in the legs and feet. Bowel and bladder function has been normal. On her pain chart, she lists pain in the lumbar region across the back going horizontally and posteriorly along both lower extremities. She said the pain has gotten worse recently. She can sit in a recliner to relieve some of the pain. She said this has really gotten worse since 2011. REVIEW OF SYSTEMS: Positive for eye glasses, swelling in the feet, leg pain with walking and at rest, asthma, bronchitis, arm weakness, back pain, joint pain, arthritis, leg weakness. She denies allergic, hematologic, endocrine, psychiatric, neurological, skin, genitourinary, gastrointestinal, and constitutional problems. PAST MEDICAL HISTORY:  Current Medical Conditions: Significant for COPD.  Prior Operations: She has undergone a hysterectomy, my lumbar fusion, cholecystectomy this year.  Medications and Allergies: No known drug allergies. Medications are albuterol, bupropion, Valium, duloxetine, Lyrica, Percocet,  OxyContin, and ranitidine. FAMILY HISTORY: Mother and father are both at this time are deceased. Cancer present in the family history. SOCIAL HISTORY: She does smoke two packs of cigarettes a day since the year [id number]. She does drink about three beers a day. No history of substance abuse. PHYSICAL EXAMINATION: Vital signs today, height 6 feet 3 inches, weight 242.2 pounds, BMI is 42.9, blood pressure 145/75, pulse 84, respiratory rate 16, temperature 97.1 Fahrenheit, and pain is 8-10/10. On physical examination, she is alert and oriented x4 and answers all questions appropriately. Memory, language, attention span and fund of knowledge are normal. Speech is clear. Speech is also fluent. Pupils are equal, round and reactive to light. Full extraocular movements. Full visual fields. She has symmetric facial sensation and movement. Hearing is intact to finger rub bilaterally. Uvula elevates in the midline. Shoulder shrug is normal. Tongue protrudes in the midline. She has 5/5 strength in both upper and lower extremities on manual exam. She does have great difficulty standing and possibly walking on the toes, more balance issues, but she is able to walk on the heels. Reflexes are 2+ in biceps, triceps, brachioradialis, knees, and ankles. Proprioception in both lower extremities. Toes are downgoing to plantar stimulation. There is no clonus. No Hoffmann sign. Muscle tone, bulk, and coordination are normal. Gait is markedly antalgic. She stoops at the waist leaning forward and does use the cane when she is walking. DIAGNOSTIC STUDIES: MRI was reviewed and what it shows is that she has a good decompression at the surgical site at 4-5, but  she certainly has a great deal of stenosis at L3-4 just above the fusion level. The stenosis and facet arthropathy at L3-4 is significant and I do believe it is certainly causing a problem that she is having in the lower extremities. She is stenotic there and has classic signs of  neurogenic claudication. She does have lateral recess stenosis also at L3-4. The rest of the spine actually looks good. The conus is normal. The cauda equina is also normal. DIAGNOSES: Lumbar stenosis. Lumbar facet arthropathy. Ms. Fayrene FearingGarren has been under the care of her physician for years and has had anti-inflammatories and has tried noninvasive means of treating this pain. She has clear breakdown of the facet and vertebrae at the L3-4 level. At this point, I think it probably is best that we proceed with a lumbar fusion. She has a slight bit of listhesis present at that level, but that is very slight, but I do think with decompression and subsequent extension of the fusion at 4-5 that the neurogenic claudication could be relieved. Trying to simply decompress above the fusion without fusing given the significant degeneration in the facets I think would be very ill advised. Risks and benefits were discussed. She understands having had the operation before. I will give her a handout today and will have her undergo a plain film, AP and lateral of the lumbar spine. We will plan on doing the surgery, 06/12/2014.

## 2014-06-13 NOTE — Plan of Care (Signed)
Problem: Phase I Progression Outcomes Goal: Log roll for position change Outcome: Progressing

## 2014-06-13 NOTE — Progress Notes (Signed)
Utilization review completed.  

## 2014-06-13 NOTE — Plan of Care (Signed)
Problem: Phase I Progression Outcomes Goal: Pain controlled with appropriate interventions Outcome: Completed/Met Date Met:  06/13/14     

## 2014-06-13 NOTE — Evaluation (Signed)
Occupational Therapy Evaluation Patient Details Name: Stephanie Chen MRN: [id number] DOB: 1956-05-13 Today's Date: 06/13/2014    History of Present Illness 58 y.o. s/p Lumbar three-four posterior lumbar interbody fusion with interbody prosthesis posterior lateral arthrodesis and posterior nonsegmental instrumentation.   Clinical Impression   Pt s/p above. Pt independent with ADLs, PTA. Feel pt will benefit from acute OT to increase independence with BADLs and reinforce precautions prior to d/c.     Follow Up Recommendations  Home health OT;Supervision/Assistance - 24 hour    Equipment Recommendations  3 in 1 bedside comode;Other (comment)    Recommendations for Other Services       Precautions / Restrictions Precautions Precautions: Back;Fall Precaution Booklet Issued: Yes (comment) Precaution Comments: educated on precautions Restrictions Weight Bearing Restrictions: No      Mobility Bed Mobility Overal bed mobility: Needs Assistance Bed Mobility: Sidelying to Sit   Sidelying to sit: Mod assist       General bed mobility comments: assist with bringing LE's off bed and trunk.  Transfers Overall transfer level: Needs assistance Equipment used: Rolling walker (2 wheeled) Transfers: Sit to/from Stand Sit to Stand: Mod assist;Min guard         General transfer comment: Mod A for sit to stand from bed. Min guard from toilet with grab bar.    Balance                                            ADL Overall ADL's : Needs assistance/impaired     Grooming: Wash/dry face;Wash/dry hands;Min guard;Standing               Lower Body Dressing: Maximal assistance;Sit to/from stand   Toilet Transfer: Minimal assistance;Ambulation;RW;Grab bars;Comfort height toilet   Toileting- Clothing Manipulation and Hygiene: Moderate assistance;Sit to/from stand       Functional mobility during ADLs: Minimal assistance;Rolling walker General ADL  Comments: Educated on use of cup for teeth care and placement of grooming items to avoid breaking precautions. Educated on AE for LB ADLs and multiple uses of reacher. Discussed possible technique for LB ADLs. Educated on safety (sitting for LB dressing, use of bag on walker, rugs, recommended not getting in tub alone).  Discussed what pt could use for toilet aide for hygiene and to get assist with this here in hospital.  Discussed positioning of pillows. Cues to stand upright with ambulation.     Vision                     Perception     Praxis      Pertinent Vitals/Pain Pain Assessment: 0-10 Pain Score:  (12) Pain Location: back Pain Intervention(s): Repositioned;Monitored during session     Hand Dominance Right   Extremity/Trunk Assessment Upper Extremity Assessment Upper Extremity Assessment: Overall WFL for tasks assessed   Lower Extremity Assessment Lower Extremity Assessment: Defer to PT evaluation       Communication Communication Communication: No difficulties   Cognition Arousal/Alertness: Awake/alert Behavior During Therapy: WFL for tasks assessed/performed Overall Cognitive Status: Within Functional Limits for tasks assessed                     General Comments       Exercises       Shoulder Instructions      Home Living Family/patient expects to be discharged to:: Private  residence Living Arrangements: Spouse/significant other Available Help at Discharge: Family (granddaughter staying with her for several days) Type of Home: House Home Access: Stairs to enter Secretary/administratorntrance Stairs-Number of Steps: 7 Entrance Stairs-Rails: Left Home Layout: One level     Bathroom Shower/Tub: Tub/shower unit Shower/tub characteristics: Curtain FirefighterBathroom Toilet: Standard     Home Equipment: Cane - single point;Tub bench;Adaptive equipment Adaptive Equipment: Long-handled sponge        Prior Functioning/Environment Level of Independence: Independent  with assistive device(s)             OT Diagnosis: Acute pain;Generalized weakness   OT Problem List: Decreased strength;Decreased range of motion;Decreased activity tolerance;Decreased knowledge of precautions;Decreased knowledge of use of DME or AE;Pain;Impaired balance (sitting and/or standing)   OT Treatment/Interventions: Self-care/ADL training;DME and/or AE instruction;Therapeutic activities;Patient/family education;Balance training    OT Goals(Current goals can be found in the care plan section) Acute Rehab OT Goals Patient Stated Goal: get comfortable OT Goal Formulation: With patient Time For Goal Achievement: 06/20/14 Potential to Achieve Goals: Good ADL Goals Pt Will Perform Grooming: with set-up;standing Pt Will Perform Lower Body Dressing: with set-up;with supervision;with adaptive equipment;sit to/from stand Pt Will Transfer to Toilet: with supervision;ambulating (3 in 1 over commode) Pt Will Perform Toileting - Clothing Manipulation and hygiene: with supervision;with adaptive equipment;sit to/from stand Additional ADL Goal #1: Pt will independently verbalize and demonstrate 3/3 back precautions. Additional ADL Goal #2: Pt will perform bed mobility at supervision level as precursor for ADLs.   OT Frequency: Min 2X/week   Barriers to D/C:            Co-evaluation              End of Session Equipment Utilized During Treatment: Gait belt;Rolling walker Nurse Communication: Other (comment) (pt in chair; no brace)  Activity Tolerance: Patient limited by pain Patient left: in chair;with call bell/phone within reach   Time: [id number] OT Time Calculation (min): 28 min Charges:  OT General Charges $OT Visit: 1 Procedure OT Evaluation $Initial OT Evaluation Tier I: 1 Procedure OT Treatments $Self Care/Home Management : 8-22 mins G-CodesEarlie Raveling:    Asuka Dusseau L OTR/L [phone] 06/13/2014, 9:28 AM

## 2014-06-13 NOTE — Progress Notes (Signed)
Patient ID: Stephanie SkeenSusan M Chen, female   DOB: 16-Feb-1956, 58 y.o.   MRN: [id number] Subjective:  The patient is alert and pleasant. She is in no apparent distress. Her back is appropriately sore.  Objective: Vital signs in last 24 hours: Temp:  [97.7 F (36.5 C)-99.7 F (37.6 C)] 99.7 F (37.6 C) (11/21 1000) Pulse Rate:  [65-93] 76 (11/21 1000) Resp:  [12-20] 18 (11/21 1000) BP: (113-188)/(46-86) 118/48 mmHg (11/21 1000) SpO2:  [94 %-100 %] 100 % (11/21 1000) Weight:  [109.901 kg (242 lb 4.6 oz)-113.717 kg (250 lb 11.2 oz)] 113.717 kg (250 lb 11.2 oz) (11/20 2241)  Intake/Output from previous day: 11/20 0701 - 11/21 0700 In: 2500 [I.V.:2500] Out: 1945 [Urine:1745; Blood:200] Intake/Output this shift:    Physical exam the patient is alert and oriented. She moving her lower extremities well.  Lab Results: No results for input(s): WBC, HGB, HCT, PLT in the last 72 hours. BMET No results for input(s): NA, K, CL, CO2, GLUCOSE, BUN, CREATININE, CALCIUM in the last 72 hours.  Studies/Results: Dg Lumbar Spine 2-3 Views  06/12/2014   CLINICAL DATA:  PLIF L3-L4  EXAM: LUMBAR SPINE - 2-3 VIEW  COMPARISON:  Lumbar spine films 05/28/2014  FINDINGS: Initial radiographs demonstrate pedicle screws in the L 4 and L5 vertebral bodies. There is a sharp tip probe posterior to the L4 for to body.  Second radiograph demonstrates sharp tip probe posterior to the L4 vertebral body and skin spreaders posterior the L3 vertebral body.  IMPRESSION: Intraoperative views as above   Electronically Signed   By: Genevive BiStewart  Edmunds M.D.   On: 06/12/2014 21:52   Dg Lumbar Spine 2-3 Views  06/12/2014   CLINICAL DATA:  L3-4 lumbar fusion.  Initial encounter.  EXAM: LUMBAR SPINE - 2-3 VIEW; DG C-ARM 61-120 MIN  COMPARISON:  Intraoperative radiographs same date. Lumbar MRI 04/22/2014.  FINDINGS: Spot PA and lateral fluoroscopic images of the lower lumbar spine demonstrate interval superior extension of the lumbar fusion  across the L3-4 disc space. There are bilateral pedicle screws at L3, L4 and L5. New interbody spacer at the L3-4 level appears well positioned.  IMPRESSION: Intraoperative views following superior extension of lumbar fusion now extending from L3 through L5. No demonstrated complication.   Electronically Signed   By: Roxy HorsemanBill  Veazey M.D.   On: 06/12/2014 21:03   Dg C-arm 61-120 Min  06/12/2014   CLINICAL DATA:  L3-4 lumbar fusion.  Initial encounter.  EXAM: LUMBAR SPINE - 2-3 VIEW; DG C-ARM 61-120 MIN  COMPARISON:  Intraoperative radiographs same date. Lumbar MRI 04/22/2014.  FINDINGS: Spot PA and lateral fluoroscopic images of the lower lumbar spine demonstrate interval superior extension of the lumbar fusion across the L3-4 disc space. There are bilateral pedicle screws at L3, L4 and L5. New interbody spacer at the L3-4 level appears well positioned.  IMPRESSION: Intraoperative views following superior extension of lumbar fusion now extending from L3 through L5. No demonstrated complication.   Electronically Signed   By: Roxy HorsemanBill  Veazey M.D.   On: 06/12/2014 21:03    Assessment/Plan: Postop day #1: We will mobilize the patient.  LOS: 1 day     Manvir Thorson D 06/13/2014, 10:36 AM

## 2014-06-13 NOTE — Plan of Care (Signed)
Problem: Phase I Progression Outcomes Goal: OOB as tolerated unless otherwise ordered Outcome: Completed/Met Date Met:  06/13/14     

## 2014-06-13 NOTE — Evaluation (Signed)
Physical Therapy Evaluation Patient Details Name: Stephanie SkeenSusan M Gadbois MRN: [id number] DOB: November 02, 1955 Today's Date: 06/13/2014   History of Present Illness  58 y.o. s/p Lumbar three-four posterior lumbar interbody fusion with interbody prosthesis posterior lateral arthrodesis and posterior nonsegmental instrumentation.  Clinical Impression  Patient is s/p PLIF surgery resulting in the deficits listed below (see PT Problem List).  Mod assist required for bed mobility and transfers.  Min assist needed for gait with RW 100 feet.  Mobility was limited by pain.  Patient will benefit from skilled PT to increase their independence and safety with mobility (while adhering to their precautions) to allow discharge home with 24-hour family assist, RW and HHPT.     Follow Up Recommendations Home health PT;Supervision/Assistance - 24 hour    Equipment Recommendations  Rolling walker with 5" wheels    Recommendations for Other Services       Precautions / Restrictions Precautions Precautions: Back;Fall Precaution Booklet Issued: Yes (comment) Precaution Comments: educated on precautions. Handout was provided to pt by OT. Restrictions Weight Bearing Restrictions: No      Mobility  Bed Mobility Overal bed mobility: Needs Assistance Bed Mobility: Sit to Sidelying   Sidelying to sit: Mod assist       General bed mobility comments: increased time required to complete; Pt very fearful of pain. Verbal/tactile cues for logroll.  Transfers Overall transfer level: Needs assistance Equipment used: Rolling walker (2 wheeled) Transfers: Sit to/from Stand Sit to Stand: Mod assist         General transfer comment: assist to power up; verbal cues for sequencing and hand placement.  Ambulation/Gait Ambulation/Gait assistance: Min assist Ambulation Distance (Feet): 10 Feet Assistive device: Rolling walker (2 wheeled) Gait Pattern/deviations: Decreased stride length;Step-through pattern   Gait  velocity interpretation: Below normal speed for age/gender    Stairs            Wheelchair Mobility    Modified Rankin (Stroke Patients Only)       Balance                                             Pertinent Vitals/Pain Pain Assessment: 0-10 Pain Score: 10-Worst pain ever Pain Location: back Pain Intervention(s): Limited activity within patient's tolerance;Premedicated before session;Repositioned    Home Living Family/patient expects to be discharged to:: Private residence Living Arrangements: Spouse/significant other Available Help at Discharge: Family Type of Home: House Home Access: Ramped entrance Entrance Stairs-Rails: Left Entrance Stairs-Number of Steps: 7 Home Layout: One level Home Equipment: Cane - single point;Tub bench;Adaptive equipment      Prior Function Level of Independence: Independent with assistive device(s)         Comments: Pt used cane for ambulation.     Hand Dominance   Dominant Hand: Right    Extremity/Trunk Assessment   Upper Extremity Assessment: Overall WFL for tasks assessed           Lower Extremity Assessment: Defer to PT evaluation         Communication   Communication: No difficulties  Cognition Arousal/Alertness: Awake/alert Behavior During Therapy: WFL for tasks assessed/performed Overall Cognitive Status: Within Functional Limits for tasks assessed                      General Comments      Exercises        Assessment/Plan  PT Assessment Patient needs continued PT services  PT Diagnosis Difficulty walking;Generalized weakness;Acute pain   PT Problem List Decreased strength;Decreased activity tolerance;Decreased balance;Decreased mobility;Decreased knowledge of precautions;Decreased safety awareness;Decreased knowledge of use of DME;Pain  PT Treatment Interventions DME instruction;Gait training;Stair training;Functional mobility training;Therapeutic  activities;Patient/family education;Balance training   PT Goals (Current goals can be found in the Care Plan section) Acute Rehab PT Goals Patient Stated Goal: decrease pain PT Goal Formulation: With patient Time For Goal Achievement: 06/20/14 Potential to Achieve Goals: Good    Frequency Min 5X/week   Barriers to discharge        Co-evaluation               End of Session Equipment Utilized During Treatment: Gait belt Activity Tolerance: Patient limited by pain Patient left: in bed;with call bell/phone within reach Nurse Communication: Mobility status         Time: 8657-84690958-1015 PT Time Calculation (min) (ACUTE ONLY): 17 min   Charges:   PT Evaluation $Initial PT Evaluation Tier I: 1 Procedure PT Treatments $Gait Training: 8-22 mins   PT G Codes:          Ilda FoilGarrow, Jisel Fleet Rene 06/13/2014, 10:27 AM

## 2014-06-14 MED ORDER — MANAGING BACK PAIN BOOK
Freq: Once | Status: AC
  Administered 2014-06-14: 05:00:00
  Filled 2014-06-14: qty 1

## 2014-06-14 NOTE — Progress Notes (Signed)
Physical Therapy Treatment Patient Details Name: Stephanie Chen MRN: [id number] DOB: [birth year] Today's Date: 06/14/2014    History of Present Illness      PT Comments    Pt with increased temp overnight.  She has been ambulating with nursing in hallway.  Pt agreeable to mobility despite report of 8/10 pain.  Pt encouraged to stay OOB in chair at least 1 hour this morning.  RN aware.  Follow Up Recommendations  Home health PT;Supervision/Assistance - 24 hour     Equipment Recommendations  Rolling walker with 5" wheels    Recommendations for Other Services       Precautions / Restrictions Precautions Precautions: Back;Fall Precaution Comments: Reviewed back precautions. Referred to handout for teaching. Restrictions Weight Bearing Restrictions: No    Mobility  Bed Mobility   Bed Mobility: Rolling Rolling: Min guard Sidelying to sit: Min guard;HOB elevated       General bed mobility comments: use of bed rail  Transfers   Equipment used: Rolling walker (2 wheeled)   Sit to Stand: Min assist         General transfer comment: verbal cues for hand placement; assist to power up  Ambulation/Gait Ambulation/Gait assistance: Min guard Ambulation Distance (Feet): 100 Feet Assistive device: Rolling walker (2 wheeled) Gait Pattern/deviations: Decreased stride length;Step-through pattern;Trunk flexed Gait velocity: decreased Gait velocity interpretation: Below normal speed for age/gender     Stairs            Wheelchair Mobility    Modified Rankin (Stroke Patients Only)       Balance                                    Cognition Arousal/Alertness: Awake/alert Behavior During Therapy: WFL for tasks assessed/performed Overall Cognitive Status: Within Functional Limits for tasks assessed                      Exercises      General Comments        Pertinent Vitals/Pain Pain Score: 8  Pain Location: back Pain  Intervention(s): Monitored during session;Repositioned;Patient requesting pain meds-RN notified    Home Living                      Prior Function            PT Goals (current goals can now be found in the care plan section) Progress towards PT goals: Progressing toward goals    Frequency  Min 5X/week    PT Plan Current plan remains appropriate    Co-evaluation             End of Session Equipment Utilized During Treatment: Gait belt Activity Tolerance: Patient limited by pain Patient left: in chair;with call bell/phone within reach;with nursing/sitter in room;with family/visitor present     Time: [id number] PT Time Calculation (min) (ACUTE ONLY): 24 min  Charges:  $Gait Training: 23-37 mins                    G Codes:      Stephanie Chen, Stephanie  Chen 06/14/2014, 10:04 AM

## 2014-06-14 NOTE — Progress Notes (Signed)
OOB hall x 2, Incentive Spirometer, TCDB, Temp 99.4. Patient appreciative of aggressive pulmonary toliet.  Pain controlled.

## 2014-06-14 NOTE — Progress Notes (Signed)
Temp 100.4 after aggressive pulmonary toilet, walking & po percocet.

## 2014-06-14 NOTE — Progress Notes (Signed)
Intermittently temp elevated 102, 101, tylenol & aggressive pulmonary toilet completed.  Pt has congestion & expiratory wheezes.  RT assisting with Nebulizer treatments.  Pt states she normally smokes 2 pk cigarettes per day.  Patient OOB walking in hall x 300 ft.  Will continue to monitor temp & encourage po pain meds alternating with IV Dilaudid.  Dilaudid tending to make patient sleep & she wants it every 2hr.  O2 sats 94% RA.  Plan additional mobilization prior to shift end.  Discussed smoking cessation resources, info left for patient review.

## 2014-06-14 NOTE — Progress Notes (Signed)
Patient ID: Stephanie SkeenSusan M Chen, female   DOB: [birth year], 58 y.o.   MRN: [id number] Subjective:  The patient is alert and pleasant. She still having a lot of pain.  Objective: Vital signs in last 24 hours: Temp:  [99.3 F (37.4 C)-102 F (38.9 C)] 99.3 F (37.4 C) (11/22 0948) Pulse Rate:  [88-107] 97 (11/22 0948) Resp:  [18-20] 20 (11/22 0948) BP: (113-165)/(58-72) 150/59 mmHg (11/22 0948) SpO2:  [90 %-95 %] 95 % (11/22 0948)  Intake/Output from previous day: 11/21 0701 - 11/22 0700 In: 100 [P.O.:100] Out: 250 [Urine:250] Intake/Output this shift:    Physical exam patient is alert and oriented. She moving her lower extremities well.  Lab Results: No results for input(s): WBC, HGB, HCT, PLT in the last 72 hours. BMET No results for input(s): NA, K, CL, CO2, GLUCOSE, BUN, CREATININE, CALCIUM in the last 72 hours.  Studies/Results: Dg Lumbar Spine 2-3 Views  06/12/2014   CLINICAL DATA:  PLIF L3-L4  EXAM: LUMBAR SPINE - 2-3 VIEW  COMPARISON:  Lumbar spine films 05/28/2014  FINDINGS: Initial radiographs demonstrate pedicle screws in the L 4 and L5 vertebral bodies. There is a sharp tip probe posterior to the L4 for to body.  Second radiograph demonstrates sharp tip probe posterior to the L4 vertebral body and skin spreaders posterior the L3 vertebral body.  IMPRESSION: Intraoperative views as above   Electronically Signed   By: Genevive BiStewart  Edmunds M.D.   On: 06/12/2014 21:52   Dg Lumbar Spine 2-3 Views  06/12/2014   CLINICAL DATA:  L3-4 lumbar fusion.  Initial encounter.  EXAM: LUMBAR SPINE - 2-3 VIEW; DG C-ARM 61-120 MIN  COMPARISON:  Intraoperative radiographs same date. Lumbar MRI 04/22/2014.  FINDINGS: Spot PA and lateral fluoroscopic images of the lower lumbar spine demonstrate interval superior extension of the lumbar fusion across the L3-4 disc space. There are bilateral pedicle screws at L3, L4 and L5. New interbody spacer at the L3-4 level appears well positioned.  IMPRESSION:  Intraoperative views following superior extension of lumbar fusion now extending from L3 through L5. No demonstrated complication.   Electronically Signed   By: Roxy HorsemanBill  Veazey M.D.   On: 06/12/2014 21:03   Dg C-arm 61-120 Min  06/12/2014   CLINICAL DATA:  L3-4 lumbar fusion.  Initial encounter.  EXAM: LUMBAR SPINE - 2-3 VIEW; DG C-ARM 61-120 MIN  COMPARISON:  Intraoperative radiographs same date. Lumbar MRI 04/22/2014.  FINDINGS: Spot PA and lateral fluoroscopic images of the lower lumbar spine demonstrate interval superior extension of the lumbar fusion across the L3-4 disc space. There are bilateral pedicle screws at L3, L4 and L5. New interbody spacer at the L3-4 level appears well positioned.  IMPRESSION: Intraoperative views following superior extension of lumbar fusion now extending from L3 through L5. No demonstrated complication.   Electronically Signed   By: Roxy HorsemanBill  Veazey M.D.   On: 06/12/2014 21:03    Assessment/Plan: Postop day #2: We will continue to mobilize the patient.  LOS: 2 days     Valta Dillon D 06/14/2014, 10:21 AM

## 2014-06-15 NOTE — Progress Notes (Addendum)
Occupational Therapy Treatment Patient Details Name: Stephanie Chen MRN: [id number] DOB: [birth year] Today's Date: 06/15/2014    History of present illness 58 y.o. s/p Lumbar three-four posterior lumbar interbody fusion with interbody prosthesis posterior lateral arthrodesis and posterior nonsegmental instrumentation.   OT comments  Pt progressing. Education provided during session.   Follow Up Recommendations  Home health OT;Supervision/Assistance - 24 hour    Equipment Recommendations  3 in 1 bedside comode;Other (comment) (AE)    Recommendations for Other Services      Precautions / Restrictions Precautions Precautions: Back;Fall Precaution Comments: Educated on back precautions. Pt had difficulty recalling. Restrictions Weight Bearing Restrictions: No       Mobility Bed Mobility Overal bed mobility: Needs Assistance Bed Mobility: Rolling;Sit to Sidelying Rolling: Min assist; supervision     Sit to sidelying: Min assist General bed mobility comments: assist with one LE when going to sidelying position. Cues for technique and precautions.  Transfers Overall transfer level: Needs assistance Equipment used: Rolling walker (2 wheeled) Transfers: Sit to/from Stand Sit to Stand: Min guard         General transfer comment: cues for hand placement.    Balance                                   ADL Overall ADL's : Needs assistance/impaired     Grooming: Wash/dry face;Oral care;Applying deodorant;Minimal assistance;Min guard;Standing (tactile cues for upright posture)   Upper Body Bathing: Sitting;Standing;Min guard   Lower Body Bathing: Set up;Supervison/ safety (washed front peri area sitting)   Upper Body Dressing : Set up;Supervision/safety; Min guard; Sitting/Standing   Lower Body Dressing: Set up;Supervision/safety;Sitting/lateral leans;With adaptive equipment (socks)   Toilet Transfer: Min guard;Ambulation;RW (cues for upright posture);  comfort height toilet with grab bar   Toileting- Clothing Manipulation and Hygiene: Moderate assistance;Sit to/from stand       Functional mobility during ADLs: Min guard;Rolling walker General ADL Comments: Educated on AE for LB ADLs as well as cost and where to purchase. Educated on safety (safe shoewear, use of bag/basket on walker, rugs, sitting for most of LB ADLs). Discussed tub transfer and how it is same as a Art therapistcar trasnsfer. Educated on use of cup for teeth care and placement of grooming items to avoid breaking precautions. Pt practiced with AE. Positioned pillows appropriately at end of session. Discussed sitting in recliner at home and positioning. Pt able to cross one leg to doff socks but cues given for precautions. Pt took break at sink and performed some UB bathing while sitting. Discussed what pt could use for toilet aide and how it works. Discussed incorporating precautions into functional activities.       Vision                     Perception     Praxis      Cognition  Awake/Alert Behavior During Therapy: WFL for tasks assessed/performed Overall Cognitive Status: Impaired/Different from baseline Area of Impairment: Orientation Orientation Level: Disoriented to;Time                   Extremity/Trunk Assessment               Exercises     Shoulder Instructions       General Comments      Pertinent Vitals/ Pain       Pain Assessment: 0-10 Pain Score: 9  Pain  Location: back and LLE Pain Descriptors / Indicators: Sore Pain Intervention(s): Monitored during session;Other (comment);Repositioned  Home Living                                          Prior Functioning/Environment              Frequency Min 2X/week     Progress Toward Goals  OT Goals(current goals can now be found in the care plan section)  Progress towards OT goals: Progressing toward goals  Acute Rehab OT Goals Patient Stated Goal: not  stated OT Goal Formulation: With patient Time For Goal Achievement: 06/20/14 Potential to Achieve Goals: Good ADL Goals Pt Will Perform Grooming: with set-up;standing Pt Will Perform Lower Body Bathing: with adaptive equipment;sit to/from stand;with set-up;with supervision Pt Will Perform Lower Body Dressing: with set-up;with supervision;with adaptive equipment;sit to/from stand Pt Will Transfer to Toilet: with supervision;ambulating (3 in 1 over commode) Pt Will Perform Toileting - Clothing Manipulation and hygiene: with supervision;with adaptive equipment;sit to/from stand Additional ADL Goal #1: Pt will independently verbalize and demonstrate 3/3 back precautions. Additional ADL Goal #2: Pt will perform bed mobility at supervision level as precursor for ADLs.   Plan Discharge plan remains appropriate    Co-evaluation                 End of Session Equipment Utilized During Treatment: Gait belt;Rolling walker   Activity Tolerance Patient limited by pain;Patient limited by fatigue   Patient Left in bed;with call bell/phone within reach;with bed alarm set   Nurse Communication Mobility status;Other (comment) (pain)        Time: [id number] OT Time Calculation (min): 31 min  Charges: OT General Charges $OT Visit: 1 Procedure OT Treatments $Self Care/Home Management : 23-37 mins  Earlie RavelingStraub, Haydn Cush L OTR/L [phone] 06/15/2014, 10:32 AM

## 2014-06-15 NOTE — Progress Notes (Signed)
Patient ID: Stephanie SkeenSusan M Chen, female   DOB: 11/28/1955, 58 y.o.   MRN: [id number] BP 108/65 mmHg  Pulse 92  Temp(Src) 99.2 F (37.3 C) (Oral)  Resp 20  Ht 5\' 4"  (1.626 m)  Wt 113.717 kg (250 lb 11.2 oz)  BMI 43.01 kg/m2  SpO2 96% Alert and oriented x 4 Moving all extremities well Complaining of pain in anterior thighs, no more pain posteriorly Wound dressing is stained, dry Mild temp.  Continue with PT.ot

## 2014-06-15 NOTE — Plan of Care (Signed)
Problem: Phase I Progression Outcomes Goal: Initial discharge plan identified Outcome: Completed/Met Date Met:  06/15/14 Home with PT

## 2014-06-15 NOTE — Plan of Care (Signed)
Problem: Phase I Progression Outcomes Goal: Hemodynamically stable Outcome: Completed/Met Date Met:  06/15/14

## 2014-06-15 NOTE — Progress Notes (Signed)
Physical Therapy Treatment Patient Details Name: Stephanie SkeenSusan M Anspach MRN: [id number] DOB: 07-28-1955 Today's Date: 06/15/2014    History of Present Illness 58 y.o. s/p Lumbar three-four posterior lumbar interbody fusion with interbody prosthesis posterior lateral arthrodesis and posterior nonsegmental instrumentation.    PT Comments    Patient requires increased time for mobility but overall making some progress. Educated to call out when needing to stand as patient asking if she could get up on her own. Patient educated on risk of falls at this time. Will continue with current POC  Follow Up Recommendations  Home health PT;Supervision/Assistance - 24 hour     Equipment Recommendations  Rolling walker with 5" wheels    Recommendations for Other Services       Precautions / Restrictions Precautions Precautions: Back;Fall Precaution Comments: reviewed back precautions. Patient able to recall all from previous session.  Restrictions Weight Bearing Restrictions: No    Mobility  Bed Mobility Overal bed mobility: Needs Assistance   Rolling: Min guard Sidelying to sit: Min guard       General bed mobility comments: requires increased time and cues to ensure log roll  Transfers Overall transfer level: Needs assistance Equipment used: Rolling walker (2 wheeled) Transfers: Sit to/from Stand Sit to Stand: Min assist         General transfer comment: verbal cues for hand placement; assist to power up  Ambulation/Gait Ambulation/Gait assistance: Min guard Ambulation Distance (Feet): 140 Feet Assistive device: Rolling walker (2 wheeled) Gait Pattern/deviations: Step-through pattern;Decreased stride length;Trunk flexed Gait velocity: decreased Gait velocity interpretation: Below normal speed for age/gender General Gait Details: Patient with decreased gait and guarded with ambulation. Cues to stand tall and within RW. Patient putting increased weight through UEs and  shoulders   Stairs            Wheelchair Mobility    Modified Rankin (Stroke Patients Only)       Balance                                    Cognition Arousal/Alertness: Awake/alert Behavior During Therapy: WFL for tasks assessed/performed Overall Cognitive Status: Within Functional Limits for tasks assessed                      Exercises      General Comments        Pertinent Vitals/Pain Pain Score: 8  Pain Location: back Pain Descriptors / Indicators: Sore Pain Intervention(s): Monitored during session    Home Living                      Prior Function            PT Goals (current goals can now be found in the care plan section) Progress towards PT goals: Progressing toward goals    Frequency  Min 5X/week    PT Plan Current plan remains appropriate    Co-evaluation             End of Session   Activity Tolerance: Patient tolerated treatment well Patient left: in chair;with call bell/phone within reach     Time: 0822-0848 PT Time Calculation (min) (ACUTE ONLY): 26 min  Charges:  $Gait Training: 8-22 mins $Therapeutic Activity: 8-22 mins                    G Codes:  Fredrich BirksRobinette, Nattaly Yebra Elizabeth 06/15/2014, 9:38 AM 06/15/2014 Fredrich Birksobinette, Catrell Morrone Elizabeth PTA [phone] pager [phone] office

## 2014-06-15 NOTE — Plan of Care (Signed)
Problem: Phase I Progression Outcomes Goal: Log roll for position change Outcome: Completed/Met Date Met:  06/15/14

## 2014-06-15 NOTE — Progress Notes (Signed)
Talked to patient about HHC choices, patient chose [facility] for HHPT; Miranda with [facility] called for arrangements; B Safal Halderman RN,BSN,MHA [phone] 

## 2014-06-16 NOTE — Discharge Instructions (Signed)

## 2014-06-16 NOTE — Progress Notes (Signed)
Pt ambulates 110 ft

## 2014-06-16 NOTE — Progress Notes (Signed)
Physical Therapy Treatment Patient Details Name: Stephanie SkeenSusan M Chen MRN: [id number] DOB: Nov 30, 1955 Today's Date: 06/16/2014    History of Present Illness 58 y.o. s/p Lumbar three-four posterior lumbar interbody fusion with interbody prosthesis posterior lateral arthrodesis and posterior nonsegmental instrumentation.    PT Comments    Patient progressing well. Wants to go home today. Patient safe to D/C from a mobility standpoint based on progression towards goals set on PT eval.    Follow Up Recommendations  Home health PT;Supervision/Assistance - 24 hour     Equipment Recommendations  Rolling walker with 5" wheels    Recommendations for Other Services       Precautions / Restrictions Precautions Precautions: Back;Fall Precaution Comments: Patient able to recall precautions this session    Mobility  Bed Mobility     Rolling: Supervision Sidelying to sit: Supervision     Sit to sidelying: Supervision General bed mobility comments: Cues for technique and precautions.  Transfers Overall transfer level: Needs assistance Equipment used: Rolling walker (2 wheeled) Transfers: Sit to/from Stand Sit to Stand: Supervision         General transfer comment: cues for hand placement.  Ambulation/Gait Ambulation/Gait assistance: Supervision Ambulation Distance (Feet): 300 Feet Assistive device: Rolling walker (2 wheeled) Gait Pattern/deviations: Step-through pattern;Decreased stride length Gait velocity: decreased   General Gait Details: Cues for posture   Stairs            Wheelchair Mobility    Modified Rankin (Stroke Patients Only)       Balance                                    Cognition Arousal/Alertness: Awake/alert Behavior During Therapy: WFL for tasks assessed/performed Overall Cognitive Status: Within Functional Limits for tasks assessed                      Exercises      General Comments        Pertinent  Vitals/Pain Pain Score: 7  Pain Location: back and LLE  Pain Descriptors / Indicators: Sore Pain Intervention(s): Monitored during session    Home Living                      Prior Function            PT Goals (current goals can now be found in the care plan section) Progress towards PT goals: Progressing toward goals    Frequency  Min 5X/week    PT Plan Current plan remains appropriate    Co-evaluation             End of Session Equipment Utilized During Treatment: Gait belt Activity Tolerance: Patient tolerated treatment well Patient left: in bed;with call bell/phone within reach     Time: 0915-0931 PT Time Calculation (min) (ACUTE ONLY): 16 min  Charges:  $Gait Training: 8-22 mins                    G Codes:      Fredrich BirksRobinette, Julia Elizabeth 06/16/2014, 12:11 PM  06/16/2014 Fredrich Birksobinette, Julia Elizabeth PTA [phone] pager [phone] office

## 2014-06-16 NOTE — Progress Notes (Signed)
Pt is being discharged home. Discharge instructions were given to patient and family 

## 2014-06-16 NOTE — Discharge Summary (Signed)
  Physician Discharge Summary  Patient ID: Stephanie SkeenSusan M Chen MRN: [id number] DOB/AGE: [birth year] 58 y.o.  Admit date: 06/12/2014 Discharge date: 06/16/2014  Admission Diagnoses:lumbar spondylolisthesis  Discharge Diagnoses:  Active Problems:   Spondylolisthesis of lumbar region   Discharged Condition: good  Hospital Course: Stephanie Chen was taken to the operating room where she underwent an uncomplicated lumbar decompression and fusion at L3/4, adjacent to her previous fusion. She has done well post op with the lower extremities improved. Her wound is clean, dry, and without signs of infection. She is voiding, and tolerating a regular diet at discharge.   Treatments: surgery: Lumbar three-four posterior lumbar interbody fusion with interbody prosthesis    Lumbar 3, Lumbar 4 nerve root decompression beyond the exposure necessary for a Plif  posterior segmental instrumentation L3-5, medtronic Legacy hardware Placement of L3 screws bilaterally Removal L4 screw left side   Discharge Exam: Blood pressure 142/57, pulse 93, temperature 99.1 F (37.3 C), temperature source Oral, resp. rate 20, height 5\' 4"  (1.626 m), weight 113.717 kg (250 lb 11.2 oz), SpO2 91 %. General appearance: alert, cooperative, appears stated age and mild distress Neurologic: Alert and oriented X 3, normal strength and tone. Normal symmetric reflexes. 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     ANORO ELLIPTA IN  Inhale 1 puff into the lungs daily.     buPROPion 300 MG 24 hr tablet  Commonly known as:  WELLBUTRIN XL  Take 300 mg by mouth  daily.     cetirizine 10 MG tablet  Commonly known as:  ZYRTEC  Take 10 mg by mouth daily as needed for allergies.     diazepam 5 MG tablet  Commonly known as:  VALIUM  Take 5 mg by mouth every 8 (eight) hours as needed for anxiety.     DULoxetine 60 MG capsule  Commonly known as:  CYMBALTA  Take 60 mg by mouth daily.     oxyCODONE-acetaminophen 10-325 MG per tablet  Commonly known as:  PERCOCET  Take 1 tablet by mouth every 6 (six) hours as needed for pain.     OXYCONTIN 40 mg T12a 12 hr tablet  Generic drug:  OxyCODONE  Take 40 mg by mouth every 12 (twelve) hours.     pregabalin 75 MG capsule  Commonly known as:  LYRICA  Take 75 mg by mouth 2 (two) times daily.     ranitidine 300 MG tablet  Commonly known as:  ZANTAC  Take 300 mg by mouth at bedtime.           Follow-up Information    Follow up with Stephanie Skoczylas L, MD In 3 weeks.   Specialty:  Neurosurgery   Why:  call office to make an appointment   Contact information:   [address] [place] [address] [phone]       Signed: Itzayana Pardy Chen 06/16/2014, 4:59 PM

## 2016-08-24 DIAGNOSIS — G894 Chronic pain syndrome: Secondary | ICD-10-CM | POA: Diagnosis not present

## 2016-08-24 DIAGNOSIS — J449 Chronic obstructive pulmonary disease, unspecified: Secondary | ICD-10-CM | POA: Diagnosis not present

## 2016-08-24 DIAGNOSIS — J019 Acute sinusitis, unspecified: Secondary | ICD-10-CM | POA: Diagnosis not present

## 2016-12-04 DIAGNOSIS — R110 Nausea: Secondary | ICD-10-CM | POA: Diagnosis not present

## 2016-12-04 DIAGNOSIS — J449 Chronic obstructive pulmonary disease, unspecified: Secondary | ICD-10-CM | POA: Diagnosis not present

## 2016-12-04 DIAGNOSIS — G894 Chronic pain syndrome: Secondary | ICD-10-CM | POA: Diagnosis not present

## 2017-03-06 DIAGNOSIS — J449 Chronic obstructive pulmonary disease, unspecified: Secondary | ICD-10-CM | POA: Diagnosis not present

## 2017-03-06 DIAGNOSIS — G894 Chronic pain syndrome: Secondary | ICD-10-CM | POA: Diagnosis not present

## 2017-03-19 DIAGNOSIS — B9689 Other specified bacterial agents as the cause of diseases classified elsewhere: Secondary | ICD-10-CM | POA: Diagnosis not present

## 2017-03-19 DIAGNOSIS — J019 Acute sinusitis, unspecified: Secondary | ICD-10-CM | POA: Diagnosis not present

## 2017-06-06 DIAGNOSIS — J019 Acute sinusitis, unspecified: Secondary | ICD-10-CM | POA: Diagnosis not present

## 2017-06-06 DIAGNOSIS — R0981 Nasal congestion: Secondary | ICD-10-CM | POA: Diagnosis not present

## 2017-06-06 DIAGNOSIS — J449 Chronic obstructive pulmonary disease, unspecified: Secondary | ICD-10-CM | POA: Diagnosis not present

## 2017-09-05 DIAGNOSIS — L509 Urticaria, unspecified: Secondary | ICD-10-CM | POA: Diagnosis not present

## 2017-09-05 DIAGNOSIS — G894 Chronic pain syndrome: Secondary | ICD-10-CM | POA: Diagnosis not present

## 2017-09-05 DIAGNOSIS — R0989 Other specified symptoms and signs involving the circulatory and respiratory systems: Secondary | ICD-10-CM | POA: Diagnosis not present

## 2017-11-29 DIAGNOSIS — G894 Chronic pain syndrome: Secondary | ICD-10-CM | POA: Diagnosis not present

## 2017-11-29 DIAGNOSIS — J449 Chronic obstructive pulmonary disease, unspecified: Secondary | ICD-10-CM | POA: Diagnosis not present

## 2018-02-28 DIAGNOSIS — Z79899 Other long term (current) drug therapy: Secondary | ICD-10-CM | POA: Diagnosis not present

## 2018-02-28 DIAGNOSIS — J449 Chronic obstructive pulmonary disease, unspecified: Secondary | ICD-10-CM | POA: Diagnosis not present

## 2018-05-30 DIAGNOSIS — G894 Chronic pain syndrome: Secondary | ICD-10-CM | POA: Diagnosis not present

## 2018-05-30 DIAGNOSIS — J449 Chronic obstructive pulmonary disease, unspecified: Secondary | ICD-10-CM | POA: Diagnosis not present

## 2018-08-27 DIAGNOSIS — J449 Chronic obstructive pulmonary disease, unspecified: Secondary | ICD-10-CM | POA: Diagnosis not present

## 2018-08-27 DIAGNOSIS — G894 Chronic pain syndrome: Secondary | ICD-10-CM | POA: Diagnosis not present

## 2019-03-11 ENCOUNTER — Inpatient Hospital Stay (HOSPITAL_COMMUNITY): Payer: Commercial Managed Care - PPO

## 2019-03-11 ENCOUNTER — Inpatient Hospital Stay (HOSPITAL_COMMUNITY)
Admission: AD | Admit: 2019-03-11 | Discharge: 2019-04-24 | DRG: 870 | Disposition: E | Payer: Commercial Managed Care - PPO | Source: Other Acute Inpatient Hospital | Attending: Pulmonary Disease | Admitting: Pulmonary Disease

## 2019-03-11 DIAGNOSIS — G92 Toxic encephalopathy: Secondary | ICD-10-CM | POA: Diagnosis present

## 2019-03-11 DIAGNOSIS — Z90710 Acquired absence of both cervix and uterus: Secondary | ICD-10-CM

## 2019-03-11 DIAGNOSIS — E871 Hypo-osmolality and hyponatremia: Secondary | ICD-10-CM | POA: Diagnosis present

## 2019-03-11 DIAGNOSIS — R410 Disorientation, unspecified: Secondary | ICD-10-CM | POA: Diagnosis not present

## 2019-03-11 DIAGNOSIS — F329 Major depressive disorder, single episode, unspecified: Secondary | ICD-10-CM | POA: Diagnosis present

## 2019-03-11 DIAGNOSIS — F10239 Alcohol dependence with withdrawal, unspecified: Secondary | ICD-10-CM | POA: Diagnosis present

## 2019-03-11 DIAGNOSIS — J181 Lobar pneumonia, unspecified organism: Secondary | ICD-10-CM | POA: Diagnosis not present

## 2019-03-11 DIAGNOSIS — F17210 Nicotine dependence, cigarettes, uncomplicated: Secondary | ICD-10-CM | POA: Diagnosis present

## 2019-03-11 DIAGNOSIS — Y95 Nosocomial condition: Secondary | ICD-10-CM | POA: Diagnosis not present

## 2019-03-11 DIAGNOSIS — I110 Hypertensive heart disease with heart failure: Secondary | ICD-10-CM | POA: Diagnosis present

## 2019-03-11 DIAGNOSIS — Z515 Encounter for palliative care: Secondary | ICD-10-CM

## 2019-03-11 DIAGNOSIS — A419 Sepsis, unspecified organism: Principal | ICD-10-CM | POA: Diagnosis present

## 2019-03-11 DIAGNOSIS — N390 Urinary tract infection, site not specified: Secondary | ICD-10-CM | POA: Diagnosis present

## 2019-03-11 DIAGNOSIS — J9602 Acute respiratory failure with hypercapnia: Secondary | ICD-10-CM | POA: Diagnosis present

## 2019-03-11 DIAGNOSIS — J441 Chronic obstructive pulmonary disease with (acute) exacerbation: Secondary | ICD-10-CM | POA: Diagnosis present

## 2019-03-11 DIAGNOSIS — D649 Anemia, unspecified: Secondary | ICD-10-CM | POA: Diagnosis present

## 2019-03-11 DIAGNOSIS — I959 Hypotension, unspecified: Secondary | ICD-10-CM | POA: Diagnosis not present

## 2019-03-11 DIAGNOSIS — E876 Hypokalemia: Secondary | ICD-10-CM | POA: Diagnosis present

## 2019-03-11 DIAGNOSIS — Z66 Do not resuscitate: Secondary | ICD-10-CM | POA: Diagnosis not present

## 2019-03-11 DIAGNOSIS — Z79899 Other long term (current) drug therapy: Secondary | ICD-10-CM

## 2019-03-11 DIAGNOSIS — G9340 Encephalopathy, unspecified: Secondary | ICD-10-CM

## 2019-03-11 DIAGNOSIS — J189 Pneumonia, unspecified organism: Secondary | ICD-10-CM | POA: Diagnosis not present

## 2019-03-11 DIAGNOSIS — F10231 Alcohol dependence with withdrawal delirium: Secondary | ICD-10-CM

## 2019-03-11 DIAGNOSIS — J9690 Respiratory failure, unspecified, unspecified whether with hypoxia or hypercapnia: Secondary | ICD-10-CM

## 2019-03-11 DIAGNOSIS — Z4659 Encounter for fitting and adjustment of other gastrointestinal appliance and device: Secondary | ICD-10-CM

## 2019-03-11 DIAGNOSIS — J9601 Acute respiratory failure with hypoxia: Secondary | ICD-10-CM | POA: Diagnosis present

## 2019-03-11 DIAGNOSIS — I340 Nonrheumatic mitral (valve) insufficiency: Secondary | ICD-10-CM | POA: Diagnosis not present

## 2019-03-11 DIAGNOSIS — G8929 Other chronic pain: Secondary | ICD-10-CM | POA: Diagnosis present

## 2019-03-11 DIAGNOSIS — U071 COVID-19: Secondary | ICD-10-CM | POA: Diagnosis not present

## 2019-03-11 DIAGNOSIS — I351 Nonrheumatic aortic (valve) insufficiency: Secondary | ICD-10-CM | POA: Diagnosis not present

## 2019-03-11 DIAGNOSIS — N179 Acute kidney failure, unspecified: Secondary | ICD-10-CM | POA: Diagnosis not present

## 2019-03-11 DIAGNOSIS — R296 Repeated falls: Secondary | ICD-10-CM | POA: Diagnosis present

## 2019-03-11 DIAGNOSIS — R8271 Bacteriuria: Secondary | ICD-10-CM | POA: Diagnosis not present

## 2019-03-11 DIAGNOSIS — Z978 Presence of other specified devices: Secondary | ICD-10-CM

## 2019-03-11 DIAGNOSIS — G928 Other toxic encephalopathy: Secondary | ICD-10-CM | POA: Diagnosis present

## 2019-03-11 DIAGNOSIS — Z9049 Acquired absence of other specified parts of digestive tract: Secondary | ICD-10-CM

## 2019-03-11 DIAGNOSIS — F10939 Alcohol use, unspecified with withdrawal, unspecified: Secondary | ICD-10-CM | POA: Diagnosis present

## 2019-03-11 DIAGNOSIS — D696 Thrombocytopenia, unspecified: Secondary | ICD-10-CM | POA: Diagnosis not present

## 2019-03-11 DIAGNOSIS — I5033 Acute on chronic diastolic (congestive) heart failure: Secondary | ICD-10-CM | POA: Diagnosis present

## 2019-03-11 DIAGNOSIS — K7460 Unspecified cirrhosis of liver: Secondary | ICD-10-CM | POA: Diagnosis present

## 2019-03-11 DIAGNOSIS — J440 Chronic obstructive pulmonary disease with acute lower respiratory infection: Secondary | ICD-10-CM | POA: Diagnosis not present

## 2019-03-11 DIAGNOSIS — R569 Unspecified convulsions: Secondary | ICD-10-CM | POA: Diagnosis not present

## 2019-03-11 DIAGNOSIS — I10 Essential (primary) hypertension: Secondary | ICD-10-CM | POA: Diagnosis present

## 2019-03-11 DIAGNOSIS — E519 Thiamine deficiency, unspecified: Secondary | ICD-10-CM | POA: Diagnosis present

## 2019-03-11 DIAGNOSIS — R451 Restlessness and agitation: Secondary | ICD-10-CM | POA: Diagnosis not present

## 2019-03-11 DIAGNOSIS — J180 Bronchopneumonia, unspecified organism: Secondary | ICD-10-CM | POA: Diagnosis not present

## 2019-03-11 DIAGNOSIS — G9349 Other encephalopathy: Secondary | ICD-10-CM | POA: Diagnosis not present

## 2019-03-11 DIAGNOSIS — I272 Pulmonary hypertension, unspecified: Secondary | ICD-10-CM | POA: Diagnosis present

## 2019-03-11 DIAGNOSIS — G9341 Metabolic encephalopathy: Secondary | ICD-10-CM | POA: Diagnosis not present

## 2019-03-11 DIAGNOSIS — I361 Nonrheumatic tricuspid (valve) insufficiency: Secondary | ICD-10-CM | POA: Diagnosis not present

## 2019-03-11 DIAGNOSIS — Z781 Physical restraint status: Secondary | ICD-10-CM

## 2019-03-11 DIAGNOSIS — J9600 Acute respiratory failure, unspecified whether with hypoxia or hypercapnia: Secondary | ICD-10-CM | POA: Diagnosis not present

## 2019-03-11 DIAGNOSIS — J9611 Chronic respiratory failure with hypoxia: Secondary | ICD-10-CM | POA: Diagnosis not present

## 2019-03-11 DIAGNOSIS — B954 Other streptococcus as the cause of diseases classified elsewhere: Secondary | ICD-10-CM | POA: Diagnosis present

## 2019-03-11 DIAGNOSIS — E119 Type 2 diabetes mellitus without complications: Secondary | ICD-10-CM | POA: Diagnosis present

## 2019-03-11 DIAGNOSIS — R4182 Altered mental status, unspecified: Secondary | ICD-10-CM | POA: Diagnosis not present

## 2019-03-11 DIAGNOSIS — M797 Fibromyalgia: Secondary | ICD-10-CM | POA: Diagnosis present

## 2019-03-11 DIAGNOSIS — Z8249 Family history of ischemic heart disease and other diseases of the circulatory system: Secondary | ICD-10-CM

## 2019-03-11 DIAGNOSIS — J449 Chronic obstructive pulmonary disease, unspecified: Secondary | ICD-10-CM | POA: Diagnosis not present

## 2019-03-11 DIAGNOSIS — Z7189 Other specified counseling: Secondary | ICD-10-CM | POA: Diagnosis not present

## 2019-03-11 DIAGNOSIS — Z79891 Long term (current) use of opiate analgesic: Secondary | ICD-10-CM

## 2019-03-11 LAB — BLOOD GAS, ARTERIAL
Acid-Base Excess: 2.4 mmol/L — ABNORMAL HIGH (ref 0.0–2.0)
Acid-Base Excess: 1.6 mmol/L (ref 0.0–2.0)
Bicarbonate: 26.8 mmol/L (ref 20.0–28.0)
Bicarbonate: 26.8 mmol/L (ref 20.0–28.0)
Drawn by: 441261
Drawn by: 308601
FIO2: 100
MECHVT: 430 mL
O2 Content: 4 L/min
O2 Saturation: 97.9 %
O2 Saturation: 99.8 %
PEEP: 5 cmH2O
Patient temperature: 100.9
Patient temperature: 98.6
RATE: 14 resp/min
pCO2 arterial: 45.6 mmHg (ref 32.0–48.0)
pCO2 arterial: 47.3 mmHg (ref 32.0–48.0)
pH, Arterial: 7.393 (ref 7.350–7.450)
pH, Arterial: 7.372 (ref 7.350–7.450)
pO2, Arterial: 115 mmHg — ABNORMAL HIGH (ref 83.0–108.0)
pO2, Arterial: 337 mmHg — ABNORMAL HIGH (ref 83.0–108.0)

## 2019-03-11 LAB — CBC
HCT: 37.6 % (ref 36.0–46.0)
Hemoglobin: 11.8 g/dL — ABNORMAL LOW (ref 12.0–15.0)
MCH: 31.5 pg (ref 26.0–34.0)
MCHC: 31.4 g/dL (ref 30.0–36.0)
MCV: 100.3 fL — ABNORMAL HIGH (ref 80.0–100.0)
Platelets: 153 10*3/uL (ref 150–400)
RBC: 3.75 MIL/uL — ABNORMAL LOW (ref 3.87–5.11)
RDW: 15.9 % — ABNORMAL HIGH (ref 11.5–15.5)
WBC: 5.3 10*3/uL (ref 4.0–10.5)
nRBC: 0 % (ref 0.0–0.2)

## 2019-03-11 LAB — TYPE AND SCREEN
ABO/RH(D): B POS
Antibody Screen: NEGATIVE

## 2019-03-11 LAB — CREATININE, SERUM
Creatinine, Ser: 0.73 mg/dL (ref 0.44–1.00)
GFR calc Af Amer: >60 mL/min (ref >60)
GFR calc non Af Amer: >60 mL/min (ref >60)

## 2019-03-11 LAB — LIPASE, BLOOD: Lipase: 23 U/L (ref 11–51)

## 2019-03-11 LAB — MRSA PCR SCREENING: MRSA by PCR: NEGATIVE

## 2019-03-11 LAB — PROCALCITONIN: Procalcitonin: <0.1 ng/mL

## 2019-03-11 LAB — AMYLASE: Amylase: 19 U/L — ABNORMAL LOW (ref 28–100)

## 2019-03-11 MED ORDER — THIAMINE HCL 100 MG/ML IJ SOLN
Freq: Once | INTRAVENOUS | Status: AC
  Administered 2019-03-11: 18:00:00 via INTRAVENOUS
  Filled 2019-03-11: qty 1000

## 2019-03-11 MED ORDER — DEXMEDETOMIDINE HCL IN NACL 200 MCG/50ML IV SOLN: 0.2000 ug/kg/h | INTRAVENOUS | Status: DC

## 2019-03-11 MED ORDER — IPRATROPIUM-ALBUTEROL 0.5-2.5 (3) MG/3ML IN SOLN
3.0000 mL | Freq: Four times a day (QID) | RESPIRATORY_TRACT | Status: DC
  Administered 2019-03-11 – 2019-03-26 (×60): 3 mL via RESPIRATORY_TRACT
  Filled 2019-03-11 (×59): qty 3

## 2019-03-11 MED ORDER — SODIUM CHLORIDE 0.9 % IV SOLN: 1.0000 g | Freq: Three times a day (TID) | INTRAVENOUS | Status: DC

## 2019-03-11 MED ORDER — METHYLPREDNISOLONE SODIUM SUCC 40 MG IJ SOLR
40.0000 mg | Freq: Three times a day (TID) | INTRAMUSCULAR | Status: DC
  Administered 2019-03-11 – 2019-03-12 (×2): 40 mg via INTRAVENOUS
  Filled 2019-03-11 (×2): qty 1

## 2019-03-11 MED ORDER — ETOMIDATE 2 MG/ML IV SOLN
40.0000 mg | Freq: Once | INTRAVENOUS | Status: AC
  Administered 2019-03-11: 40 mg via INTRAVENOUS

## 2019-03-11 MED ORDER — HYDRALAZINE HCL 20 MG/ML IJ SOLN
10.0000 mg | INTRAMUSCULAR | Status: DC | PRN
  Administered 2019-03-11 – 2019-03-24 (×12): 10 mg via INTRAVENOUS
  Filled 2019-03-11 (×12): qty 1

## 2019-03-11 MED ORDER — MIDAZOLAM HCL 2 MG/2ML IJ SOLN
2.0000 mg | INTRAMUSCULAR | Status: DC | PRN
  Administered 2019-03-12 (×4): 2 mg via INTRAVENOUS
  Filled 2019-03-11 (×4): qty 2

## 2019-03-11 MED ORDER — CHLORHEXIDINE GLUCONATE 0.12% ORAL RINSE (MEDLINE KIT)
15.0000 mL | Freq: Two times a day (BID) | OROMUCOSAL | Status: DC
  Administered 2019-03-11 – 2019-03-18 (×14): 15 mL via OROMUCOSAL

## 2019-03-11 MED ORDER — VANCOMYCIN HCL 10 G IV SOLR
2000.0000 mg | Freq: Once | INTRAVENOUS | Status: AC
  Administered 2019-03-11: 2000 mg via INTRAVENOUS
  Filled 2019-03-11: qty 2000

## 2019-03-11 MED ORDER — MIDAZOLAM HCL 2 MG/2ML IJ SOLN
INTRAMUSCULAR | Status: AC
  Administered 2019-03-11: 2 mg
  Filled 2019-03-11: qty 2

## 2019-03-11 MED ORDER — VANCOMYCIN HCL 10 G IV SOLR
1250.0000 mg | INTRAVENOUS | Status: DC
  Administered 2019-03-12 – 2019-03-13 (×2): 1250 mg via INTRAVENOUS
  Filled 2019-03-11 (×2): qty 1250

## 2019-03-11 MED ORDER — FENTANYL CITRATE (PF) 100 MCG/2ML IJ SOLN
INTRAMUSCULAR | Status: AC
  Administered 2019-03-11: 100 ug
  Filled 2019-03-11: qty 2

## 2019-03-11 MED ORDER — ORAL CARE MOUTH RINSE
15.0000 mL | OROMUCOSAL | Status: DC
  Administered 2019-03-11 – 2019-03-23 (×117): 15 mL via OROMUCOSAL

## 2019-03-11 MED ORDER — MIDAZOLAM HCL 2 MG/2ML IJ SOLN
2.0000 mg | Freq: Once | INTRAMUSCULAR | Status: DC
  Filled 2019-03-11: qty 2

## 2019-03-11 MED ORDER — SODIUM CHLORIDE 0.9 % IV SOLN
INTRAVENOUS | Status: DC
  Administered 2019-03-12 – 2019-03-20 (×8): via INTRAVENOUS

## 2019-03-11 MED ORDER — DEXMEDETOMIDINE HCL IN NACL 200 MCG/50ML IV SOLN
0.4000 ug/kg/h | INTRAVENOUS | Status: DC
  Administered 2019-03-11: 1.2 ug/kg/h via INTRAVENOUS
  Filled 2019-03-11: qty 50

## 2019-03-11 MED ORDER — DEXMEDETOMIDINE HCL IN NACL 400 MCG/100ML IV SOLN
0.4000 ug/kg/h | INTRAVENOUS | Status: AC
  Administered 2019-03-11 (×2): 1.2 ug/kg/h via INTRAVENOUS
  Filled 2019-03-11 (×2): qty 100

## 2019-03-11 MED ORDER — HEPARIN SODIUM (PORCINE) 5000 UNIT/ML IJ SOLN
5000.0000 [IU] | Freq: Three times a day (TID) | INTRAMUSCULAR | Status: DC
  Administered 2019-03-11 – 2019-03-17 (×17): 5000 [IU] via SUBCUTANEOUS
  Filled 2019-03-11 (×17): qty 1

## 2019-03-11 MED ORDER — PANTOPRAZOLE SODIUM 40 MG PO PACK
40.0000 mg | PACK | Freq: Every day | ORAL | Status: DC
  Administered 2019-03-11: 40 mg
  Filled 2019-03-11: qty 20

## 2019-03-11 MED ORDER — SODIUM CHLORIDE 0.9 % IV SOLN
2.0000 g | Freq: Three times a day (TID) | INTRAVENOUS | Status: DC
  Administered 2019-03-11 – 2019-03-16 (×14): 2 g via INTRAVENOUS
  Filled 2019-03-11 (×15): qty 2

## 2019-03-11 MED ORDER — ORAL CARE MOUTH RINSE: 15.0000 mL | OROMUCOSAL | Status: DC

## 2019-03-11 MED ORDER — CHLORHEXIDINE GLUCONATE CLOTH 2 % EX PADS
6.0000 | MEDICATED_PAD | Freq: Every day | CUTANEOUS | Status: DC
  Administered 2019-03-11 – 2019-03-15 (×5): 6 via TOPICAL

## 2019-03-11 MED ORDER — FENTANYL CITRATE (PF) 100 MCG/2ML IJ SOLN
25.0000 ug | INTRAMUSCULAR | Status: DC | PRN
  Administered 2019-03-11 – 2019-03-26 (×31): 25 ug via INTRAVENOUS
  Filled 2019-03-11 (×33): qty 2

## 2019-03-11 MED ORDER — CHLORHEXIDINE GLUCONATE 0.12% ORAL RINSE (MEDLINE KIT): 15.0000 mL | Freq: Two times a day (BID) | OROMUCOSAL | Status: DC

## 2019-03-11 MED ORDER — ONDANSETRON HCL 4 MG/2ML IJ SOLN: 4.0000 mg | Freq: Four times a day (QID) | INTRAMUSCULAR | Status: DC | PRN

## 2019-03-11 MED ORDER — FENTANYL CITRATE (PF) 100 MCG/2ML IJ SOLN
100.0000 ug | Freq: Once | INTRAMUSCULAR | Status: AC
  Administered 2019-03-15: 100 ug via INTRAVENOUS

## 2019-03-11 NOTE — Procedures (Addendum)
Intubation Procedure Note Stephanie Chen [id number] 11-14-1955  Procedure: Intubation Indications: Airway protection and maintenance  Procedure Details Consent: Unable to obtain consent because of altered level of consciousness. Time Out: Verified patient identification, verified procedure, site/side was marked, verified correct patient position, special equipment/implants available, medications/allergies/relevent history reviewed, required imaging and test results available.  Performed  Maximum sterile technique was used including cap, gloves, gown, hand hygiene and mask.  MAC and 4  Full view with video assisted intubation Dry mucous membranes.     Evaluation Hemodynamic Status: BP stable throughout; O2 sats: stable throughout Patient's Current Condition: stable Complications: No apparent complications Patient did tolerate procedure well. Chest X-ray ordered to verify placement.  CXR: pending.   [name] 03/06/2019

## 2019-03-11 NOTE — Progress Notes (Signed)
Pharmacy Antibiotic Note  Stephanie Chen is a 63 y.o. female with hx EtOH, COPD and asthma presented to the [facility] on 03/23/2019 with AMS.  She was treated for UTI with ceftriaxone (given on 8/18 at 1p) and EtOH withdrawal with precedex.  She was subsequently transferred to [facility] on 8/18 for further workup and medical management.  To start vancomycin and cefepime for PNA.  - scr 0.7 at [facility]: - vancomycin 2000 mg IV x1, then 1250 mg IV q24h for est AUC 491 - change cefepime dose to 2gm IV q8h  _____________________________________  Height: 5\' 4"  (162.6 cm) Weight: 203 lb 14.8 oz (92.5 kg) IBW/kg (Calculated) : 54.7  No data recorded.  No results for input(s): WBC, CREATININE, LATICACIDVEN, VANCOTROUGH, VANCOPEAK, VANCORANDOM, GENTTROUGH, GENTPEAK, GENTRANDOM, TOBRATROUGH, TOBRAPEAK, TOBRARND, AMIKACINPEAK, AMIKACINTROU, AMIKACIN in the last 168 hours.  CrCl cannot be calculated (Patient's most recent lab result is older than the maximum 21 days allowed.).    No Known Allergies  Thank you for allowing pharmacy to be a part of this patient's care.  Lynelle Doctor 03/07/2019 6:09 PM

## 2019-03-11 NOTE — Progress Notes (Signed)
eLink Physician-Brief Progress Note Patient Name: Stephanie Chen DOB: 1956-02-07 MRN: [id number]   Date of Service  03/19/2019  HPI/Events of Note  Hypertension - BP = 187/90.   eICU Interventions  Will order: 1. Hydralazine 10 mg IV Q 4 hours PRN SBP > 170 or DBP > 100.     Intervention Category Major Interventions: Hypertension - evaluation and management  Sommer,Steven Eugene 03/04/2019, 11:06 PM

## 2019-03-11 NOTE — Progress Notes (Signed)
Pt transported to and from CT on [facility] without complication.  RT to monitor and assess as needed.  

## 2019-03-11 NOTE — H&P (Signed)
NAME:  Stephanie SkeenSusan M Chen, MRN:  [id number], DOB:  [birth year], LOS: 0 ADMISSION DATE:  03/12/2019, CONSULTATION DATE:  02/22/2019 REFERRING MD:  [facility]H, CHIEF COMPLAINT:  etoh withdraw  Brief History   63 yo female with acute etoh withdraw and ? sepsis  History of present illness   63 yo with pmh copd, and etoh abuse who prsented with ams to osh. Per limited records sent (pt unresponsive so all history is obtained from chart review). Pt has been confused for 2 weeks. Reportedly was seen by her pcp for the same confusion but it is unclear what occurred after that visit. Daughter had reported pt was having hallucinations, multiple falls and increased weakness. Over the past 7 days she has been utilizing her inhalers more frequently, although husband denies this.   Upon presentation to osh she was tremulous and concern over DT with acute etoh withdraw was raised. She was given multiple doses of ativan/valium and subsequently started on precedex. Husband via the phone states that she "may drink 2-3 beers a day" last drink was yesterday afternoon possibly (he is unsure as he was at work). He is sure she did not drink anything last pm however as she was restless and "so confused" . She has been progressively worsen over the past 24 hours and not sleeping well for the past 2 days. Denies sick contacts, fevers/chills/cough. She has been taking her regularly prescribed medications.   Upon arrival to our ICU pt is poorly responsive, not protecting airway and abdominally breathing. She is febrile but hemodynamically stable at this time.  Past Medical History   Past Medical History:  Diagnosis Date  . Arthritis   . Asthma   . COPD (chronic obstructive pulmonary disease)   . Fibromyalgia   . Pneumonia   . Shortness of breath dyspnea     Significant Hospital Events   8[facility]18[facility]  Consults:    Procedures:    Significant Diagnostic Tests:    Micro Data:  8/18 sars2: neg 8/18: blood  cx: pending 8/18 urine cx: pending 8/18 resp cx: pending  Antimicrobials:  Cefepime 8/18-> vanc 8/18->  Interim history/subjective:  8/18: intubated for airway protection.   Objective   There were no vitals taken for this visit.       No intake or output data in the 24 hours ending 02/27/2019 1740 There were no vitals filed for this visit.  Examination: General: chronically ill appearing female, unresponsive in bed, spontaneously moving all four HEENT: NCAT, EOMI, PERRLA, MM very dry but pink, poor dentition Lungs: bilateral wheezing, diffusely. Abdominal breathing with sonorous respirations.  Cardiovascular: RRR, no m/g/r Abdomen: obese, soft, NT,ND, BS+ Extremities: + edema Skin: no rashes, warm and dry Neuro: moves all 4 extremities   Resolved Hospital Problem list     Assessment & Plan:  Acute resp failure:  2/2 mental status, intubated for airway protection -titrate vent -once mental status improves will move to PS trials AECOPD:  -diffuse wheezing -steroids/bronchodilators scheduled for now.  -resp cx -empiric abx for now until cxr done and can eval for pna  Fever:  -on arrival -pancx -empiric abx  AMS:  -do not see cth from osh -with concern from falls and "leaning to one side" will order cth once airway protected -? 2/2 etoh use or infection -cont precedex gtt for now -follow clinically  H/o etoh use with ? Withdraw:  -precedex infusion to ciwa when able.  -cth pending Chronic pain:  -  Transaminitis:  -noted and will follow with cmp in am.   Hyponatremia:  -likely 2/2 etoh use.   Best practice:  Diet: npo Pain/Anxiety/Delirium protocol (if indicated): precedex VAP protocol (if indicated): per protocol DVT prophylaxis: heparin GI prophylaxis: ppi Glucose control: n/a Mobility: bedrest Code Status: full Family Communication: 8/18 Disposition: ICU Labs   CBC: No results for input(s): WBC, NEUTROABS, HGB, HCT, MCV, PLT in the last 168  hours.  Basic Metabolic Panel: No results for input(s): NA, K, CL, CO2, GLUCOSE, BUN, CREATININE, CALCIUM, MG, PHOS in the last 168 hours. GFR: CrCl cannot be calculated (Patient's most recent lab result is older than the maximum 21 days allowed.). No results for input(s): PROCALCITON, WBC, LATICACIDVEN in the last 168 hours.  Liver Function Tests: No results for input(s): AST, ALT, ALKPHOS, BILITOT, PROT, ALBUMIN in the last 168 hours. No results for input(s): LIPASE, AMYLASE in the last 168 hours. No results for input(s): AMMONIA in the last 168 hours.  ABG No results found for: PHART, PCO2ART, PO2ART, HCO3, TCO2, ACIDBASEDEF, O2SAT   Coagulation Profile: No results for input(s): INR, PROTIME in the last 168 hours.  Cardiac Enzymes: No results for input(s): CKTOTAL, CKMB, CKMBINDEX, TROPONINI in the last 168 hours.  HbA1C: No results found for: HGBA1C  CBG: No results for input(s): GLUCAP in the last 168 hours.  Review of Systems:   Unobtainable 2/2 pt's mental status  Past Medical History  She,  has a past medical history of Arthritis, Asthma, COPD (chronic obstructive pulmonary disease), Fibromyalgia, Pneumonia, and Shortness of breath dyspnea.   Surgical History    Past Surgical History:  Procedure Laterality Date  . ABDOMINAL HYSTERECTOMY    . APPENDECTOMY    . carpel tunnel Bilateral   . CHOLECYSTECTOMY       Social History   reports that she has been smoking cigarettes. She has a 45.00 pack-year smoking history. She does not have any smokeless tobacco history on file. She reports current alcohol use of about 3.0 standard drinks of alcohol per week. She reports that she does not use drugs.   Family History   Her family history is not on file.   Allergies No Known Allergies   Home Medications  Prior to Admission medications   Medication Sig Start Date End Date Taking? Authorizing Provider  albuterol (PROVENTIL HFA;VENTOLIN HFA) 108 (90 BASE) MCG/ACT  inhaler Inhale 2 puffs into the lungs every 6 (six) hours as needed for wheezing or shortness of breath.    [provider]  buPROPion (WELLBUTRIN XL) 300 MG 24 hr tablet Take 300 mg by mouth daily.    [provider]  cetirizine (ZYRTEC) 10 MG tablet Take 10 mg by mouth daily as needed for allergies.    [provider]  diazepam (VALIUM) 5 MG tablet Take 5 mg by mouth every 8 (eight) hours as needed for anxiety.    [provider]  DULoxetine (CYMBALTA) 60 MG capsule Take 60 mg by mouth daily.    [provider]  OxyCODONE (OXYCONTIN) 40 mg T12A 12 hr tablet Take 40 mg by mouth every 12 (twelve) hours.    [provider]  oxyCODONE-acetaminophen (PERCOCET) 10-325 MG per tablet Take 1 tablet by mouth every 6 (six) hours as needed for pain.    [provider]  pregabalin (LYRICA) 75 MG capsule Take 75 mg by mouth 2 (two) times daily.    [provider]  ranitidine (ZANTAC) 300 MG tablet Take 300 mg by  mouth at bedtime.    [provider]  Umeclidinium-Vilanterol (ANORO ELLIPTA IN) Inhale 1 puff into the lungs daily.    [provider]     Critical care time: The patient is critically ill with multiple organ systems failure and requires high complexity decision making for assessment and support, frequent evaluation and titration of therapies, application of advanced monitoring technologies and extensive interpretation of multiple databases.  Critical care time 55 mins. This represents my time independent of the NP's/PA's/med students/residents time taking care of the pt. This is excluding procedures.     Briant SitesJessica Shakeyla Giebler DO Pager: [phone] After hours pager: [phone]  [facility] 03/04/2019, 5:40 PM

## 2019-03-11 NOTE — Progress Notes (Signed)
eLink Physician-Brief Progress Note Patient Name: Stephanie Chen DOB: May 26, 1956 MRN: [id number]   Date of Service  13-Mar-2019  HPI/Events of Note  Intubated patient - Request for OGT placement.   eICU Interventions  Will order: 1. OGT to LIS.      Intervention Category Major Interventions: Other:  Lysle Dingwall March 13, 2019, 8:28 PM

## 2019-03-12 ENCOUNTER — Inpatient Hospital Stay (HOSPITAL_COMMUNITY): Payer: Commercial Managed Care - PPO

## 2019-03-12 ENCOUNTER — Other Ambulatory Visit: Payer: Self-pay

## 2019-03-12 ENCOUNTER — Encounter (HOSPITAL_COMMUNITY): Payer: Self-pay

## 2019-03-12 DIAGNOSIS — I351 Nonrheumatic aortic (valve) insufficiency: Secondary | ICD-10-CM

## 2019-03-12 DIAGNOSIS — I361 Nonrheumatic tricuspid (valve) insufficiency: Secondary | ICD-10-CM

## 2019-03-12 LAB — COMPREHENSIVE METABOLIC PANEL
ALT: 46 U/L — ABNORMAL HIGH (ref 0–44)
AST: 51 U/L — ABNORMAL HIGH (ref 15–41)
Albumin: 2.6 g/dL — ABNORMAL LOW (ref 3.5–5.0)
Alkaline Phosphatase: 90 U/L (ref 38–126)
Anion gap: 9 (ref 5–15)
BUN: 18 mg/dL (ref 8–23)
CO2: 24 mmol/L (ref 22–32)
Calcium: 7.9 mg/dL — ABNORMAL LOW (ref 8.9–10.3)
Chloride: 101 mmol/L (ref 98–111)
Creatinine, Ser: 0.85 mg/dL (ref 0.44–1.00)
GFR calc Af Amer: >60 mL/min (ref >60)
GFR calc non Af Amer: >60 mL/min (ref >60)
Glucose, Bld: 166 mg/dL — ABNORMAL HIGH (ref 70–99)
Potassium: 3.3 mmol/L — ABNORMAL LOW (ref 3.5–5.1)
Sodium: 134 mmol/L — ABNORMAL LOW (ref 135–145)
Total Bilirubin: 1.7 mg/dL — ABNORMAL HIGH (ref 0.3–1.2)
Total Protein: 6.2 g/dL — ABNORMAL LOW (ref 6.5–8.1)

## 2019-03-12 LAB — URINALYSIS, ROUTINE W REFLEX MICROSCOPIC
Bacteria, UA: NONE SEEN
Bilirubin Urine: NEGATIVE
Glucose, UA: NEGATIVE mg/dL
Hgb urine dipstick: NEGATIVE
Ketones, ur: NEGATIVE mg/dL
Leukocytes,Ua: NEGATIVE
Nitrite: POSITIVE — AB
Protein, ur: 30 mg/dL — AB
Specific Gravity, Urine: 1.017 (ref 1.005–1.030)
pH: 6 (ref 5.0–8.0)

## 2019-03-12 LAB — HIV ANTIBODY (ROUTINE TESTING W REFLEX): HIV Screen 4th Generation wRfx: NONREACTIVE

## 2019-03-12 LAB — CBC
HCT: 37.9 % (ref 36.0–46.0)
Hemoglobin: 11.8 g/dL — ABNORMAL LOW (ref 12.0–15.0)
MCH: 31.1 pg (ref 26.0–34.0)
MCHC: 31.1 g/dL (ref 30.0–36.0)
MCV: 100 fL (ref 80.0–100.0)
Platelets: 110 10*3/uL — ABNORMAL LOW (ref 150–400)
RBC: 3.79 MIL/uL — ABNORMAL LOW (ref 3.87–5.11)
RDW: 15.8 % — ABNORMAL HIGH (ref 11.5–15.5)
WBC: 6 10*3/uL (ref 4.0–10.5)
nRBC: 0 % (ref 0.0–0.2)

## 2019-03-12 LAB — ECHOCARDIOGRAM COMPLETE
Height: 64 in
Weight: 3358.05 oz

## 2019-03-12 LAB — ABO/RH: ABO/RH(D): B POS

## 2019-03-12 LAB — TRIGLYCERIDES: Triglycerides: 77 mg/dL (ref <150)

## 2019-03-12 LAB — PROCALCITONIN: Procalcitonin: <0.1 ng/mL

## 2019-03-12 LAB — PHOSPHORUS: Phosphorus: 3.1 mg/dL (ref 2.5–4.6)

## 2019-03-12 LAB — MAGNESIUM: Magnesium: 1.8 mg/dL (ref 1.7–2.4)

## 2019-03-12 LAB — AMMONIA: Ammonia: 34 umol/L (ref 9–35)

## 2019-03-12 LAB — GLUCOSE, CAPILLARY: Glucose-Capillary: 117 mg/dL — ABNORMAL HIGH (ref 70–99)

## 2019-03-12 MED ORDER — OXYCODONE HCL 5 MG PO TABS
5.0000 mg | ORAL_TABLET | Freq: Four times a day (QID) | ORAL | Status: DC | PRN
  Administered 2019-03-14 – 2019-03-17 (×6): 5 mg
  Filled 2019-03-12 (×6): qty 1

## 2019-03-12 MED ORDER — NICARDIPINE HCL IN NACL 20-0.86 MG/200ML-% IV SOLN
0.0000 mg/h | INTRAVENOUS | Status: DC
  Administered 2019-03-12 – 2019-03-13 (×2): 5 mg/h via INTRAVENOUS
  Administered 2019-03-13: 14:00:00 2.5 mg/h via INTRAVENOUS
  Administered 2019-03-13: 19:00:00 5 mg/h via INTRAVENOUS
  Administered 2019-03-14 (×4): 2.5 mg/h via INTRAVENOUS
  Administered 2019-03-15: 19:00:00 5 mg/h via INTRAVENOUS
  Administered 2019-03-15 (×2): 2.5 mg/h via INTRAVENOUS
  Administered 2019-03-15: 10 mg/h via INTRAVENOUS
  Administered 2019-03-15: 22:00:00 15 mg/h via INTRAVENOUS
  Administered 2019-03-15: 2.5 mg/h via INTRAVENOUS
  Administered 2019-03-16: 02:00:00 10 mg/h via INTRAVENOUS
  Administered 2019-03-16: 20:00:00 15 mg/h via INTRAVENOUS
  Administered 2019-03-16: 15:00:00 5 mg/h via INTRAVENOUS
  Administered 2019-03-16 (×2): 10 mg/h via INTRAVENOUS
  Administered 2019-03-16 (×3): 15 mg/h via INTRAVENOUS
  Administered 2019-03-16 (×2): 10 mg/h via INTRAVENOUS
  Administered 2019-03-17: 01:00:00 5 mg/h via INTRAVENOUS
  Filled 2019-03-12 (×28): qty 200

## 2019-03-12 MED ORDER — LORAZEPAM 2 MG/ML IJ SOLN
1.0000 mg | INTRAMUSCULAR | Status: DC | PRN
  Administered 2019-03-12 – 2019-03-21 (×12): 2 mg via INTRAVENOUS
  Administered 2019-03-21 (×5): 1 mg via INTRAVENOUS
  Filled 2019-03-12 (×18): qty 1

## 2019-03-12 MED ORDER — FAMOTIDINE 20 MG PO TABS
20.0000 mg | ORAL_TABLET | Freq: Two times a day (BID) | ORAL | Status: DC
  Filled 2019-03-12: qty 1

## 2019-03-12 MED ORDER — OXYCODONE-ACETAMINOPHEN 5-325 MG PO TABS
1.0000 | ORAL_TABLET | Freq: Four times a day (QID) | ORAL | Status: DC | PRN
  Administered 2019-03-14 – 2019-03-17 (×5): 1
  Filled 2019-03-12 (×5): qty 1

## 2019-03-12 MED ORDER — PROPOFOL 1000 MG/100ML IV EMUL
INTRAVENOUS | Status: AC
  Filled 2019-03-12: qty 100

## 2019-03-12 MED ORDER — LORATADINE 10 MG PO TABS
10.0000 mg | ORAL_TABLET | Freq: Every day | ORAL | Status: DC
  Filled 2019-03-12: qty 1

## 2019-03-12 MED ORDER — DULOXETINE HCL 30 MG PO CPEP
60.0000 mg | ORAL_CAPSULE | Freq: Every day | ORAL | Status: DC
  Administered 2019-03-12: 60 mg via ORAL
  Filled 2019-03-12 (×4): qty 2

## 2019-03-12 MED ORDER — METHYLPREDNISOLONE SODIUM SUCC 40 MG IJ SOLR
40.0000 mg | Freq: Two times a day (BID) | INTRAMUSCULAR | Status: DC
  Administered 2019-03-12 – 2019-03-16 (×8): 40 mg via INTRAVENOUS
  Filled 2019-03-12 (×8): qty 1

## 2019-03-12 MED ORDER — FAMOTIDINE 20 MG PO TABS
20.0000 mg | ORAL_TABLET | Freq: Two times a day (BID) | ORAL | Status: DC
  Administered 2019-03-12 – 2019-03-19 (×16): 20 mg
  Filled 2019-03-12 (×15): qty 1

## 2019-03-12 MED ORDER — FUROSEMIDE 10 MG/ML IJ SOLN
20.0000 mg | Freq: Once | INTRAMUSCULAR | Status: AC
  Administered 2019-03-12: 11:00:00 20 mg via INTRAVENOUS
  Filled 2019-03-12: qty 2

## 2019-03-12 MED ORDER — BUPROPION HCL ER (XL) 300 MG PO TB24
300.0000 mg | ORAL_TABLET | Freq: Every day | ORAL | Status: DC
  Administered 2019-03-12: 300 mg via ORAL
  Filled 2019-03-12 (×2): qty 1

## 2019-03-12 MED ORDER — OXYCODONE-ACETAMINOPHEN 5-325 MG PO TABS: 1.0000 | ORAL_TABLET | Freq: Four times a day (QID) | ORAL | Status: DC | PRN

## 2019-03-12 MED ORDER — PROPOFOL 1000 MG/100ML IV EMUL
5.0000 ug/kg/min | INTRAVENOUS | Status: DC
  Administered 2019-03-12 (×2): 50 ug/kg/min via INTRAVENOUS
  Administered 2019-03-12: 08:00:00 5 ug/kg/min via INTRAVENOUS
  Administered 2019-03-12: 16:00:00 50 ug/kg/min via INTRAVENOUS
  Administered 2019-03-12: 23:00:00 60 ug/kg/min via INTRAVENOUS
  Administered 2019-03-13 (×2): 25 ug/kg/min via INTRAVENOUS
  Administered 2019-03-13: 04:00:00 50 ug/kg/min via INTRAVENOUS
  Administered 2019-03-14: 19:00:00 25 ug/kg/min via INTRAVENOUS
  Administered 2019-03-14: 50 ug/kg/min via INTRAVENOUS
  Administered 2019-03-14: 14:00:00 25 ug/kg/min via INTRAVENOUS
  Administered 2019-03-14: 04:00:00 50 ug/kg/min via INTRAVENOUS
  Administered 2019-03-14: 25 ug/kg/min via INTRAVENOUS
  Administered 2019-03-15 (×5): 35 ug/kg/min via INTRAVENOUS
  Administered 2019-03-16 (×2): 40 ug/kg/min via INTRAVENOUS
  Filled 2019-03-12 (×22): qty 100

## 2019-03-12 MED ORDER — OXYCODONE-ACETAMINOPHEN 10-325 MG PO TABS: 1.0000 | ORAL_TABLET | Freq: Four times a day (QID) | ORAL | Status: DC | PRN

## 2019-03-12 MED ORDER — OXYCODONE HCL 5 MG PO TABS: 5.0000 mg | ORAL_TABLET | Freq: Four times a day (QID) | ORAL | Status: DC | PRN

## 2019-03-12 MED ORDER — LORATADINE 10 MG PO TABS
10.0000 mg | ORAL_TABLET | Freq: Every day | ORAL | Status: DC
  Administered 2019-03-12 – 2019-03-21 (×10): 10 mg
  Filled 2019-03-12 (×9): qty 1

## 2019-03-12 MED ORDER — PREGABALIN 75 MG PO CAPS
75.0000 mg | ORAL_CAPSULE | Freq: Two times a day (BID) | ORAL | Status: DC
  Administered 2019-03-12 – 2019-03-17 (×11): 75 mg
  Filled 2019-03-12 (×10): qty 1

## 2019-03-12 MED ORDER — MAGNESIUM OXIDE 400 (241.3 MG) MG PO TABS
400.0000 mg | ORAL_TABLET | Freq: Two times a day (BID) | ORAL | Status: AC
  Administered 2019-03-12 (×2): 400 mg via ORAL
  Filled 2019-03-12 (×2): qty 1

## 2019-03-12 MED ORDER — PREGABALIN 75 MG PO CAPS
75.0000 mg | ORAL_CAPSULE | Freq: Two times a day (BID) | ORAL | Status: DC
  Filled 2019-03-12: qty 1

## 2019-03-12 MED ORDER — POTASSIUM CHLORIDE 20 MEQ PO PACK
40.0000 meq | PACK | Freq: Two times a day (BID) | ORAL | Status: AC
  Administered 2019-03-12 (×2): 40 meq via ORAL
  Filled 2019-03-12 (×2): qty 2

## 2019-03-12 MED ORDER — BUPROPION HCL ER (XL) 300 MG PO TB24
300.0000 mg | ORAL_TABLET | Freq: Every day | ORAL | Status: DC
  Filled 2019-03-12: qty 1

## 2019-03-12 NOTE — Progress Notes (Signed)
NAME:  Stephanie Chen, MRN:  [id number], DOB:  [birth year], LOS: 1 ADMISSION DATE:  04/03/19, CONSULTATION DATE:  04/03/19 REFERRING MD:  OSH, CHIEF COMPLAINT:  etoh withdraw  Brief History   63 yo female with acute etoh withdraw and ? sepsis  Past Medical History   Past Medical History:  Diagnosis Date  . Arthritis   . Asthma   . COPD (chronic obstructive pulmonary disease)   . Fibromyalgia   . Pneumonia   . Shortness of breath dyspnea     Significant Hospital Events   8/18: transferred [facility] to [facility]  Consults:    Procedures:    Significant Diagnostic Tests:  8/18 cth: no acute process  Micro Data:  8/18 sars2: neg 8/18: blood cx: pending 8/18 urine cx: pending 8/18 resp cx: pending  Antimicrobials:  Cefepime 8/18-> vanc 8/18->  Interim history/subjective:  8/18: intubated for airway protection.  8/19: sat performed but pt remains unable to follow commands, agitated but purposeful which is reassuring. Started on propofol to wean off precedex.   Objective   Blood pressure (!) 253/117, pulse 69, temperature 99.3 F (37.4 C), temperature source Axillary, resp. rate (!) 22, height 5\' 4"  (1.626 m), weight 95.2 kg, SpO2 99 %.    Vent Mode: CPAP;PSV FiO2 (%):  [30 %-100 %] 30 % Set Rate:  [14 bmp] 14 bmp Vt Set:  [430 mL] 430 mL PEEP:  [5 cmH20] 5 cmH20 Pressure Support:  [10 cmH20] 10 cmH20 Plateau Pressure:  [16 cmH20-18 cmH20] 17 cmH20   Intake/Output Summary (Last 24 hours) at 03/12/2019 0900 Last data filed at 03/12/2019 0400 Gross per 24 hour  Intake 878.16 ml  Output 450 ml  Net 428.16 ml   Filed Weights   April 03, 2019 1755 03/12/19 0500  Weight: 92.5 kg 95.2 kg    Examination: General: chronically ill appearing female, unresponsive in bed, spontaneously moving all four HEENT: NCAT,  PERRLA, MM very dry but pink, edentulous Lungs: clear bilaterally Cardiovascular: RRR, no m/g/r Abdomen: obese, soft, NT,ND, BS+ Extremities: + edema Skin: no  rashes, warm and dry Neuro: moves all 4 extremities, non focal. Not following commands however.    Resolved Hospital Problem list     Assessment & Plan:  Acute resp failure:  2/2 mental status, intubated for airway protection -titrate vent -once mental status improves will move to PS trials -pulm edema on cxr. Will give dose of lasix.  AECOPD:  -diffuse wheezing: improved -steroids (wean steroids today with improvement in resp status) -bronchodilators scheduled for now.  -resp cx -empiric abx for now until cxr done and can eval for pna  Fever:  -on arrival but resolved today.  -pancx pending -empiric abx -pct pending  AMS:  -cth negative -ammonia level pending -? 2/2 etoh use or infection -change sedation to propofol for now and then hopefully back to precedex tomorrow.  -follow clinically  H/o etoh use with ? Withdraw:  -continuous infusion to prn ciwa when able.  -cth neg Chronic pain:  - home meds to continue but at lower doses to ensure no withdrawal  Transaminitis with hyperbilirubinemia:  -ruq u/s  -after can start tube feeds.   Chronic normocytic anemia:  Thrombocytopenia:  -likely 2/2 chronic etoh use -follow trend -no overt bleeding and no acute indication for transfusion  Hyponatremia:  -likely 2/2 etoh use -cont ivf Hypokalemia/hypomag:  -replace  Best practice:  Diet: npo Pain/Anxiety/Delirium protocol (if indicated): propofol VAP protocol (if indicated): per protocol DVT prophylaxis: heparin GI prophylaxis:  h2 blocker Glucose control: n/a Mobility: bedrest Code Status: full Family Communication: 8/18 husband via phone Disposition: ICU Labs   CBC: Recent Labs  Lab September 18, 2018 1829 03/12/19 0221  WBC 5.3 6.0  HGB 11.8* 11.8*  HCT 37.6 37.9  MCV 100.3* 100.0  PLT 153 110*    Basic Metabolic Panel: Recent Labs  Lab September 18, 2018 1829 03/12/19 0221  NA  --  134*  K  --  3.3*  CL  --  101  CO2  --  24  GLUCOSE  --  166*  BUN  --   18  CREATININE 0.73 0.85  CALCIUM  --  7.9*  MG  --  1.8  PHOS  --  3.1   GFR: Estimated Creatinine Clearance: 74.8 mL/min (by C-G formula based on SCr of 0.85 mg/dL). Recent Labs  Lab September 18, 2018 1829 03/12/19 0221  PROCALCITON <0.10  --   WBC 5.3 6.0    Liver Function Tests: Recent Labs  Lab 03/12/19 0221  AST 51*  ALT 46*  ALKPHOS 90  BILITOT 1.7*  PROT 6.2*  ALBUMIN 2.6*   Recent Labs  Lab September 18, 2018 1829  LIPASE 23  AMYLASE 19*   No results for input(s): AMMONIA in the last 168 hours.  ABG    Component Value Date/Time   PHART 7.372 February 26, 202020 1938   PCO2ART 47.3 February 26, 202020 1938   PO2ART 337 (H) February 26, 202020 1938   HCO3 26.8 February 26, 202020 1938   O2SAT 99.8 February 26, 202020 1938     Coagulation Profile: No results for input(s): INR, PROTIME in the last 168 hours.  Cardiac Enzymes: No results for input(s): CKTOTAL, CKMB, CKMBINDEX, TROPONINI in the last 168 hours.  HbA1C: No results found for: HGBA1C  CBG: No results for input(s): GLUCAP in the last 168 hours.    Critical care time: The patient is critically ill with multiple organ systems failure and requires high complexity decision making for assessment and support, frequent evaluation and titration of therapies, application of advanced monitoring technologies and extensive interpretation of multiple databases.  Critical care time 47 mins. This represents my time independent of the NP's/PA's/med students/residents time taking care of the pt. This is excluding procedures.     Briant SitesJessica Mikai Meints DO Pager: [phone] After hours pager: [phone]  [facility] 03/12/2019, 9:00 AM

## 2019-03-12 NOTE — Progress Notes (Signed)
eLink Physician-Brief Progress Note Patient Name: Stephanie Chen DOB: 07-Feb-1956 MRN: [id number]   Date of Service  03/12/2019  HPI/Events of Note  Bradycardia - Likely d/t Precedex IV infusion. HR now = 51 with Precedex IV infusion turned off.   eICU Interventions  Will try to sedate with Fentanyl and Versed IV PRN already ordered. However, may need a continues infusion if not able to sedate adequately with PRN's.     Intervention Category Major Interventions: Hypotension - evaluation and management  Rhina Kramme Eugene 03/12/2019, 2:54 AM

## 2019-03-12 NOTE — Progress Notes (Addendum)
Pts HR dropped into 50's. CCM aware. Precedex drip turned off at 0150. Will utilize PRN versed/fentanyl if necessary.

## 2019-03-12 NOTE — Progress Notes (Signed)
  Echocardiogram 2D Echocardiogram has been performed.  Darlina Sicilian M 03/12/2019, 1:17 PM

## 2019-03-12 NOTE — Progress Notes (Signed)
Pt. Attempting to self extubated. No sedation currently on. Versed, fentanyl given and Precedex restarted. Pt. Also placed in restraints to prevent her from actively pulling at tube to come out. MD made aware. She was unable to follow commands, Pupils were dilated, HR in 130-140's sustaining, BP in 200's/100's and RR in 40's. Pt. RASS is 3-4, Precedex is not helping sedate patient. MD made aware and Propofol ordered. Increased Propofol until RASS -1. Precedex has now been weaned off and pt. Appears to be more comfortable and VS are in an acceptable range. However, refusing q2h turns and repositions self independently.

## 2019-03-12 NOTE — Progress Notes (Signed)
Unable to complete echo report in [facility] due to IT issues  EF 55% inferobasal hypokinesis mild LVH Abnormal GLS -11.9  Moderate bi atrial enlargement Moderate MR Moderate TR Moderate RV enlargement and hypokinesis Elevated EDP  AV sclerosis with mild AR Estimated PA systolic 40 mmHg  Jenkins Rouge

## 2019-03-12 NOTE — Progress Notes (Addendum)
Notified MD Ruthann Cancer in regards to patient being agitated, HR 130s-140s ST, RR 45/min. Patient not able to follow commands, trying to self-extubate, having to place patient in bilateral wrist restraints. MD Ruthann Cancer ordered for propofol and to continue bilateral wrist restraints.

## 2019-03-12 NOTE — Progress Notes (Signed)
Pt's ET TUBE was at 25  Cm and RT placed tube at 23 at the lip. Pt became very agittated when RT talked to her

## 2019-03-12 NOTE — Progress Notes (Signed)
eLink Physician-Brief Progress Note Patient Name: Stephanie Chen DOB: 08/05/1955 MRN: [id number]   Date of Service  03/12/2019  HPI/Events of Note  Pt with ETOH withdrawal and altered mental status. Head CT yesterday was negative. Likely combination of uncontrolled essential HTN and ETOH delirium.  eICU Interventions  Begin Cardene infusion for BP of 215/115, arterial line to closely monitor BP, substitute Ativan for Versed and give liberally to address ETOH delirium.        Kerry Kass Donte Kary 03/12/2019, 10:38 PM

## 2019-03-13 ENCOUNTER — Inpatient Hospital Stay: Payer: Self-pay

## 2019-03-13 DIAGNOSIS — Z978 Presence of other specified devices: Secondary | ICD-10-CM

## 2019-03-13 DIAGNOSIS — J9601 Acute respiratory failure with hypoxia: Secondary | ICD-10-CM

## 2019-03-13 LAB — COMPREHENSIVE METABOLIC PANEL
ALT: 44 U/L (ref 0–44)
AST: 39 U/L (ref 15–41)
Albumin: 2.4 g/dL — ABNORMAL LOW (ref 3.5–5.0)
Alkaline Phosphatase: 75 U/L (ref 38–126)
Anion gap: 8 (ref 5–15)
BUN: 21 mg/dL (ref 8–23)
CO2: 25 mmol/L (ref 22–32)
Calcium: 7.8 mg/dL — ABNORMAL LOW (ref 8.9–10.3)
Chloride: 101 mmol/L (ref 98–111)
Creatinine, Ser: 0.98 mg/dL (ref 0.44–1.00)
GFR calc Af Amer: >60 mL/min (ref >60)
GFR calc non Af Amer: >60 mL/min (ref >60)
Glucose, Bld: 122 mg/dL — ABNORMAL HIGH (ref 70–99)
Potassium: 3.9 mmol/L (ref 3.5–5.1)
Sodium: 134 mmol/L — ABNORMAL LOW (ref 135–145)
Total Bilirubin: 1.6 mg/dL — ABNORMAL HIGH (ref 0.3–1.2)
Total Protein: 5.9 g/dL — ABNORMAL LOW (ref 6.5–8.1)

## 2019-03-13 LAB — CBC
HCT: 35.6 % — ABNORMAL LOW (ref 36.0–46.0)
Hemoglobin: 11.3 g/dL — ABNORMAL LOW (ref 12.0–15.0)
MCH: 31.7 pg (ref 26.0–34.0)
MCHC: 31.7 g/dL (ref 30.0–36.0)
MCV: 100 fL (ref 80.0–100.0)
Platelets: 123 10*3/uL — ABNORMAL LOW (ref 150–400)
RBC: 3.56 MIL/uL — ABNORMAL LOW (ref 3.87–5.11)
RDW: 16 % — ABNORMAL HIGH (ref 11.5–15.5)
WBC: 9.6 10*3/uL (ref 4.0–10.5)
nRBC: 0 % (ref 0.0–0.2)

## 2019-03-13 LAB — MAGNESIUM
Magnesium: 1.7 mg/dL (ref 1.7–2.4)
Magnesium: 2.4 mg/dL (ref 1.7–2.4)
Magnesium: 2 mg/dL (ref 1.7–2.4)

## 2019-03-13 LAB — GLUCOSE, CAPILLARY
Glucose-Capillary: 124 mg/dL — ABNORMAL HIGH (ref 70–99)
Glucose-Capillary: 88 mg/dL (ref 70–99)
Glucose-Capillary: 119 mg/dL — ABNORMAL HIGH (ref 70–99)
Glucose-Capillary: 111 mg/dL — ABNORMAL HIGH (ref 70–99)

## 2019-03-13 LAB — PHOSPHORUS
Phosphorus: 2.6 mg/dL (ref 2.5–4.6)
Phosphorus: 2.8 mg/dL (ref 2.5–4.6)
Phosphorus: 2.4 mg/dL — ABNORMAL LOW (ref 2.5–4.6)

## 2019-03-13 LAB — PROCALCITONIN: Procalcitonin: 1.03 ng/mL

## 2019-03-13 MED ORDER — SODIUM CHLORIDE 0.9 % IV SOLN
1.0000 mg | Freq: Once | INTRAVENOUS | Status: AC
  Administered 2019-03-13: 1 mg via INTRAVENOUS
  Filled 2019-03-13: qty 0.2

## 2019-03-13 MED ORDER — MAGNESIUM SULFATE 2 GM/50ML IV SOLN
2.0000 g | Freq: Once | INTRAVENOUS | Status: AC
  Administered 2019-03-13: 09:00:00 2 g via INTRAVENOUS
  Filled 2019-03-13: qty 50

## 2019-03-13 MED ORDER — PRO-STAT SUGAR FREE PO LIQD
30.0000 mL | Freq: Two times a day (BID) | ORAL | Status: DC
  Administered 2019-03-13 – 2019-03-26 (×27): 30 mL
  Filled 2019-03-13 (×27): qty 30

## 2019-03-13 MED ORDER — SODIUM CHLORIDE 0.9% FLUSH
10.0000 mL | Freq: Two times a day (BID) | INTRAVENOUS | Status: DC
  Administered 2019-03-13 – 2019-03-22 (×18): 10 mL
  Administered 2019-03-23: 40 mL
  Administered 2019-03-24 – 2019-03-26 (×4): 10 mL

## 2019-03-13 MED ORDER — THIAMINE HCL 100 MG/ML IJ SOLN
100.0000 mg | Freq: Every day | INTRAMUSCULAR | Status: DC
  Administered 2019-03-13 – 2019-03-17 (×5): 100 mg via INTRAVENOUS
  Filled 2019-03-13 (×5): qty 2

## 2019-03-13 MED ORDER — ADULT MULTIVITAMIN LIQUID CH
15.0000 mL | Freq: Every day | ORAL | Status: DC
  Administered 2019-03-13 – 2019-03-26 (×14): 15 mL
  Filled 2019-03-13 (×14): qty 15

## 2019-03-13 MED ORDER — SODIUM CHLORIDE 0.9% FLUSH: 10.0000 mL | INTRAVENOUS | Status: DC | PRN

## 2019-03-13 MED ORDER — CHLORHEXIDINE GLUCONATE CLOTH 2 % EX PADS
6.0000 | MEDICATED_PAD | Freq: Every day | CUTANEOUS | Status: DC
  Administered 2019-03-13 – 2019-03-18 (×4): 6 via TOPICAL

## 2019-03-13 MED ORDER — VITAL HIGH PROTEIN PO LIQD
1000.0000 mL | ORAL | Status: DC
  Administered 2019-03-13 – 2019-03-26 (×14): 1000 mL

## 2019-03-13 NOTE — Progress Notes (Addendum)
VAST consulted to place third IV access. Pt's bilateral arms are swollen and severely bruised with some skin tears. VAS RN recommending CL for this patient as she doesn't have any area appropriate for another IV access. Advised pt's nurse, Sarah of assessment findings.

## 2019-03-13 NOTE — Procedures (Addendum)
Arterial Catheter Insertion Procedure Note KHELANI KOPS [id number] Feb 16, 1956  Procedure: Insertion of Arterial Catheter  Indications: Blood pressure monitoring  Procedure Details Consent: Unable to obtain consent because of altered level of consciousness. Time Out: Verified patient identification, verified procedure, site/side was marked, verified correct patient position, special equipment/implants available, medications/allergies/relevent history reviewed, required imaging and test results available.  Performed  Maximum sterile technique was used including antiseptics, cap, gloves, gown, hand hygiene, mask and sheet. Skin prep: Chlorhexidine; local anesthetic administered 20 gauge catheter was inserted into right radial artery using the Seldinger technique. ULTRASOUND GUIDANCE USED: NO Evaluation Blood flow good; BP tracing good. Complications: No apparent complications.   [name] 03/13/2019

## 2019-03-13 NOTE — Progress Notes (Signed)
NAME:  Stephanie Chen, MRN:  [id number], DOB:  [birth year], LOS: 2 ADMISSION DATE:  03/20/2019, CONSULTATION DATE:  03/10/2019 REFERRING MD:  OSH, CHIEF COMPLAINT:  etoh withdraw  Brief History   62 yo female with acute etoh withdraw and ? sepsis  Past Medical History   Past Medical History:  Diagnosis Date  . Arthritis   . Asthma   . COPD (chronic obstructive pulmonary disease) ([place])   . Fibromyalgia   . Pneumonia   . Shortness of breath dyspnea     Significant Hospital Events   8/18: transferred from OSH to [facility]  Consults:    Procedures:  8/19: arterial line  Significant Diagnostic Tests:  8/18 cth: no acute process  Micro Data:  8/18 sars2: neg 8/18: blood cx: pending 8/18 urine cx: pending 8/18 resp cx: pending  Antimicrobials:  Cefepime 8/18-> vanc 8/18->  Interim history/subjective:  8/18: intubated for airway protection.  8/19: sat performed but pt remains unable to follow commands, agitated but purposeful which is reassuring. Started on propofol to wean off precedex.  8/20: arterial line placed overnight for hypertension? Also placed on cardene gtt. On sbt this am but still not following commands.   Objective   Blood pressure (!) 174/52, pulse 77, temperature 97.8 F (36.6 C), temperature source Axillary, resp. rate (!) 27, height 5\' 4"  (1.626 m), weight 95 kg, SpO2 96 %.    Vent Mode: CPAP;PSV FiO2 (%):  [30 %-50 %] 30 % Set Rate:  [14 bmp] 14 bmp Vt Set:  [430 mL] 430 mL PEEP:  [5 cmH20] 5 cmH20 Pressure Support:  [10 cmH20] 10 cmH20 Plateau Pressure:  [9 cmH20-16 cmH20] 9 cmH20   Intake/Output Summary (Last 24 hours) at 03/13/2019 0736 Last data filed at 03/13/2019 0600 Gross per 24 hour  Intake 1827.27 ml  Output 2250 ml  Net -422.73 ml   Filed Weights   03/17/2019 1755 03/12/19 0500 03/13/19 0500  Weight: 92.5 kg 95.2 kg 95 kg    Examination: General: chronically ill appearing female, unresponsive in bed, spontaneously moving all four HEENT:  NCAT,  PERRLA, MM very dry but pink, edentulous Lungs: clear bilaterally Cardiovascular: RRR, no m/g/r Abdomen: obese, soft, NT,ND, BS+ Extremities: + edema Skin: no rashes, warm and dry Neuro: moves all 4 extremities, non focal. Not following commands however.    Resolved Hospital Problem list     Assessment & Plan:  Acute resp failure:  2/2 mental status, intubated for airway protection -titrate vent -once mental status improves will move to PS trials -pulm edema on cxr. Will give dose of lasix.  -cont daily sbt so when mental status allows can extubate AECOPD:  -diffuse wheezing: improved -steroids (wean steroids today with improvement in resp status) -bronchodilators scheduled for now.  -resp cx -empiric abx for now until cxr done and can eval for pna Mild pulm htn:  -rvsp 33% on echo   Hypertension:  -on cardene gtt -not on agents at home -? MRI for pres- like syndrome. (mri down but hopefully up today. Does not warrant transfer to [facility] at this time but would like in next 48-72 hours) -may warrant neuro consult in near future if not waking up in next few days. -husband is adamant pt does not abuse alcohol  -goal no less than systolic 831 today with previous readings [phone] HFpEF:  -diastolic dysfunction on echo  -LVEF 55% -without acute exacerbation  Fever:  -afebrile today -pancx ngtd -empiric abx -pct <0.10 to 1.03 today  AMS:  -  cth negative -ammonia leve lnormal -? 2/2 etoh use or infection -change sedation to propofol for now and then hopefully back to precedex tomorrow.  -follow clinically  H/o etoh use with ? Withdraw:  -continuous infusion to prn ciwa when able.  -cth neg -cont thiamine/folate -husband remains resolute that pt does not abuse alcohol Chronic pain:  - home meds to continue but at lower doses to ensure no withdrawal  Transaminitis with hyperbilirubinemia:  -ruq u/s with cirrhosis but no gb -downtrending  Chronic normocytic  anemia:  Thrombocytopenia:  -likely 2/2 chronic etoh use -follow trend -no overt bleeding and no acute indication for transfusion  Hyponatremia:  -likely 2/2 etoh use -cont ivf Hypokalemia: resolved hypomag:  -replace  Best practice:  Diet:tube feeds ordered Pain/Anxiety/Delirium protocol (if indicated): propofol VAP protocol (if indicated): per protocol DVT prophylaxis: heparin GI prophylaxis: h2 blocker Glucose control: n/a Mobility: bedrest Code Status: full Family Communication: 8/19 husband at bedside.  Disposition: ICU Labs   CBC: Recent Labs  Lab 2018-08-24 1829 03/12/19 0221 03/13/19 0214  WBC 5.3 6.0 9.6  HGB 11.8* 11.8* 11.3*  HCT 37.6 37.9 35.6*  MCV 100.3* 100.0 100.0  PLT 153 110* 123*    Basic Metabolic Panel: Recent Labs  Lab 2018-08-24 1829 03/12/19 0221 03/13/19 0214  NA  --  134* 134*  K  --  3.3* 3.9  CL  --  101 101  CO2  --  24 25  GLUCOSE  --  166* 122*  BUN  --  18 21  CREATININE 0.73 0.85 0.98  CALCIUM  --  7.9* 7.8*  MG  --  1.8 1.7  PHOS  --  3.1 2.6   GFR: Estimated Creatinine Clearance: 64.8 mL/min (by C-G formula based on SCr of 0.98 mg/dL). Recent Labs  Lab 2018-08-24 1829 03/12/19 0221 03/12/19 0940 03/13/19 0214  PROCALCITON <0.10  --  <0.10 1.03  WBC 5.3 6.0  --  9.6    Liver Function Tests: Recent Labs  Lab 03/12/19 0221 03/13/19 0214  AST 51* 39  ALT 46* 44  ALKPHOS 90 75  BILITOT 1.7* 1.6*  PROT 6.2* 5.9*  ALBUMIN 2.6* 2.4*   Recent Labs  Lab 2018-08-24 1829  LIPASE 23  AMYLASE 19*   Recent Labs  Lab 03/12/19 0940  AMMONIA 34    ABG    Component Value Date/Time   PHART 7.372 2020-08-118 1938   PCO2ART 47.3 2020-08-118 1938   PO2ART 337 (H) 2020-08-118 1938   HCO3 26.8 2020-08-118 1938   O2SAT 99.8 2020-08-118 1938     Coagulation Profile: No results for input(s): INR, PROTIME in the last 168 hours.  Cardiac Enzymes: No results for input(s): CKTOTAL, CKMB, CKMBINDEX, TROPONINI in the last  168 hours.  HbA1C: No results found for: HGBA1C  CBG: Recent Labs  Lab 2018-08-24 1746  GLUCAP 117*      Critical care time: The patient is critically ill with multiple organ systems failure and requires high complexity decision making for assessment and support, frequent evaluation and titration of therapies, application of advanced monitoring technologies and extensive interpretation of multiple databases.  Critical care time 43 mins. This represents my time independent of the NP's/PA's/med students/residents time taking care of the pt. This is excluding procedures.     Briant SitesJessica Taurus Alamo DO Pager: [phone] After hours pager: [phone]  [facility] 03/13/2019, 7:36 AM

## 2019-03-13 NOTE — Progress Notes (Signed)
Peripherally Inserted Central Catheter/Midline Placement  The IV Nurse has discussed with the patient and/or persons authorized to consent for the patient, the purpose of this procedure and the potential benefits and risks involved with this procedure.  The benefits include less needle sticks, lab draws from the catheter, and the patient may be discharged home with the catheter. Risks include, but not limited to, infection, bleeding, blood clot (thrombus formation), and puncture of an artery; nerve damage and irregular heartbeat and possibility to perform a PICC exchange if needed/ordered by physician.  Alternatives to this procedure were also discussed.  Bard Power PICC patient education guide, fact sheet on infection prevention and patient information card has been provided to patient /or left at bedside.    PICC/Midline Placement Documentation  PICC Triple Lumen 46/27/03 PICC Right Basilic 37 cm 0 cm (Active)  Indication for Insertion or Continuance of Line Prolonged intravenous therapies 03/13/19 1422  Exposed Catheter (cm) 0 cm 03/13/19 1422  Site Assessment Clean;Dry;Intact 03/13/19 1422  Lumen #1 Status Flushed;Saline locked;Blood return noted 03/13/19 1422  Lumen #2 Status Flushed;Saline locked;Blood return noted 03/13/19 1422  Lumen #3 Status Flushed;Blood return noted;Saline locked 03/13/19 1422  Dressing Type Transparent;Securing device 03/13/19 1422  Dressing Status Clean;Dry;Intact;Antimicrobial disc in place 03/13/19 1422  Dressing Change Due 03/20/19 03/13/19 1422       Frances Maywood 03/13/2019, 2:25 PM

## 2019-03-13 NOTE — Progress Notes (Signed)
Updated husband via phone

## 2019-03-13 NOTE — Progress Notes (Signed)
Initial Nutrition Assessment   DOCUMENTATION CODES:   Obesity unspecified  INTERVENTION:  - continue Vital High Protein @ 40 ml/hr with 30 ml prostat BID. - this regimen + kcal from current propofol rate provides 1310 kcal (101% estimated kcal need), 114 grams protein, and 802 ml free water. - will order 15 ml liquid multivitamin per OGT/day.  - free water flush, if desired, to be per MD/NP.  Monitor magnesium, potassium, and phosphorus daily for at least 3 days, MD to replete as needed, as pt is at risk for refeeding syndrome given alcohol abuse.   NUTRITION DIAGNOSIS:   Inadequate oral intake related to inability to eat as evidenced by NPO status.  GOAL:   Provide needs based on ASPEN/SCCM guidelines  MONITOR:   Vent status, TF tolerance, Labs, Weight trends  REASON FOR ASSESSMENT:   Ventilator, Consult Enteral/tube feeding initiation and management  ASSESSMENT:   63 year old with medical history of COPD and alcohol abuse. She presented to the ED with AMS (confusion x2 weeks PTA) and OSH. Her daughter reported that the patient was also experiencing hallucinations, increased weakness, and had multiple falls PTA. Over the past 7 days, she may have been using her inhalers more frequently. She was tremulous at the time of presentation and there was concern for DTs with acute alcohol withdrawal. She was given multiple doses of Ativan and was started on Precedex. Husband reported that patient drinks 2-3 beers/day. Upon arrival to the ICU, she was poorly responsive, not protecting her airway, and was abdominally breath. She was also febrile.  Patient was intubated on 8/18 at ~1822; OGT also placed. Patient's husband is at bedside. Informed him of plan to start TF today. He reports patient does not eat breakfast or lunch but will snack throughout the day instead. He cooks a large dinner each night and patient always eats well for this meal. He denies her having any chewing or swallowing  difficulties. He states her usual weight is 204-206 lb. Current weight is 209 lb and weight on admission (8/18) was 204 lb. Used admission weight to estimate needs.  Per notes: - acute respiratory failure 2/2 AMS--plan for daily SBTs to assess for extubation - mild pulmonary HTN - plan for MRI once MRI working - possible need for Neuro consult in the next few days - AMS--thought to be 2/2 alcohol or infection; propofol today and hopeful for back to precedex tomorrow - transaminitis with hyperbilirubinemia - chronic normocytic anemia - mild hyponatremia   Patient is currently intubated on ventilator support MV: 17.5 L/min Temp (24hrs), Avg:98 F (36.7 C), Min:97.5 F (36.4 C), Max:98.5 F (36.9 C) Propofol: 5.7 ml/hr (150 kcal) BP: 176/57 and MAP: 87  Labs reviewed; Na: 134 mmol/l, Ca: 7.8 mg/dl. K, Mg, and Phos all WDL.  Medications reviewed; 20 mg pepcid per OGT BID, 1 mg IV folic x1 dose 4/27, 062 mg mag-ox x2 doses 8/19, 2 g IV Mg sulfate x1 run 8/20, 40 mg solu-medrol BID, 40 mEq Klor-con x2 doses 8/19, 100 mg IV thiamine/day.  IVF; NS @ 50 ml/hr.  Drip; propofol @ 10 mcg/kg/min.      NUTRITION - FOCUSED PHYSICAL EXAM:  completed to upper body only; no muscle or fat wasting.   Diet Order:   Diet Order            Diet NPO time specified  Diet effective now              EDUCATION NEEDS:   No  education needs have been identified at this time  Skin:  Skin Assessment: Reviewed RN Assessment  Last BM:  PTA/unknown  Height:   Ht Readings from Last 1 Encounters:  03/01/2019 5\' 4"  (1.626 m)    Weight:   Wt Readings from Last 1 Encounters:  03/13/19 95 kg    Ideal Body Weight:  54.5 kg  BMI:  Body mass index is 35.95 kg/m.  Estimated Nutritional Needs:   Kcal:  [id number] kcal  Protein:  >/= 109 grams  Fluid:  >/= 1.8 L/day     Trenton GammonJessica Mang Hazelrigg, MS, RD, LDN, [facility] Inpatient Clinical Dietitian Pager # [phone] After hours/weekend pager #  [phone]

## 2019-03-13 NOTE — Progress Notes (Signed)
Pt was placed on full support at 1135 due to agitation. RT will continue to monitor

## 2019-03-14 ENCOUNTER — Inpatient Hospital Stay (HOSPITAL_COMMUNITY): Payer: Commercial Managed Care - PPO

## 2019-03-14 DIAGNOSIS — U071 COVID-19: Secondary | ICD-10-CM

## 2019-03-14 LAB — COMPREHENSIVE METABOLIC PANEL
ALT: 43 U/L (ref 0–44)
AST: 33 U/L (ref 15–41)
Albumin: 2.9 g/dL — ABNORMAL LOW (ref 3.5–5.0)
Alkaline Phosphatase: 78 U/L (ref 38–126)
Anion gap: 8 (ref 5–15)
BUN: 25 mg/dL — ABNORMAL HIGH (ref 8–23)
CO2: 24 mmol/L (ref 22–32)
Calcium: 7.7 mg/dL — ABNORMAL LOW (ref 8.9–10.3)
Chloride: 101 mmol/L (ref 98–111)
Creatinine, Ser: 0.94 mg/dL (ref 0.44–1.00)
GFR calc Af Amer: >60 mL/min (ref >60)
GFR calc non Af Amer: >60 mL/min (ref >60)
Glucose, Bld: 140 mg/dL — ABNORMAL HIGH (ref 70–99)
Potassium: 3.5 mmol/L (ref 3.5–5.1)
Sodium: 133 mmol/L — ABNORMAL LOW (ref 135–145)
Total Bilirubin: 1.7 mg/dL — ABNORMAL HIGH (ref 0.3–1.2)
Total Protein: 6.8 g/dL (ref 6.5–8.1)

## 2019-03-14 LAB — CBC
HCT: 36.2 % (ref 36.0–46.0)
Hemoglobin: 11.3 g/dL — ABNORMAL LOW (ref 12.0–15.0)
MCH: 30.6 pg (ref 26.0–34.0)
MCHC: 31.2 g/dL (ref 30.0–36.0)
MCV: 98.1 fL (ref 80.0–100.0)
Platelets: 167 10*3/uL (ref 150–400)
RBC: 3.69 MIL/uL — ABNORMAL LOW (ref 3.87–5.11)
RDW: 16.1 % — ABNORMAL HIGH (ref 11.5–15.5)
WBC: 10.5 10*3/uL (ref 4.0–10.5)
nRBC: 0 % (ref 0.0–0.2)

## 2019-03-14 LAB — PHOSPHORUS
Phosphorus: 2.3 mg/dL — ABNORMAL LOW (ref 2.5–4.6)
Phosphorus: 3.1 mg/dL (ref 2.5–4.6)

## 2019-03-14 LAB — CULTURE, RESPIRATORY W GRAM STAIN: Culture: NORMAL

## 2019-03-14 LAB — URINE CULTURE: Culture: >=100000 — AB

## 2019-03-14 LAB — GLUCOSE, CAPILLARY
Glucose-Capillary: 126 mg/dL — ABNORMAL HIGH (ref 70–99)
Glucose-Capillary: 134 mg/dL — ABNORMAL HIGH (ref 70–99)
Glucose-Capillary: 148 mg/dL — ABNORMAL HIGH (ref 70–99)
Glucose-Capillary: 151 mg/dL — ABNORMAL HIGH (ref 70–99)
Glucose-Capillary: 156 mg/dL — ABNORMAL HIGH (ref 70–99)

## 2019-03-14 LAB — PROCALCITONIN: Procalcitonin: 0.66 ng/mL

## 2019-03-14 LAB — MAGNESIUM
Magnesium: 2.2 mg/dL (ref 1.7–2.4)
Magnesium: 2.2 mg/dL (ref 1.7–2.4)

## 2019-03-14 MED ORDER — VANCOMYCIN HCL IN DEXTROSE 1-5 GM/200ML-% IV SOLN
1000.0000 mg | INTRAVENOUS | Status: DC
  Administered 2019-03-14 – 2019-03-15 (×2): 1000 mg via INTRAVENOUS
  Filled 2019-03-14 (×2): qty 200

## 2019-03-14 NOTE — Progress Notes (Addendum)
NAME:  Stephanie SkeenSusan M Chen, MRN:  [id number], DOB:  1956-02-06, LOS: 3 ADMISSION DATE:  2019/01/03, CONSULTATION DATE:  09-Mar-2019 REFERRING MD:  OSH, CHIEF COMPLAINT:  ETOH withdrawal  Brief History   63 yo female with acute etoh withdraw and ? Sepsis No overnight events   Past Medical History   Past Medical History:  Diagnosis Date  . Arthritis   . Asthma   . COPD (chronic obstructive pulmonary disease) ([facility])   . Fibromyalgia   . Pneumonia   . Shortness of breath dyspnea     Significant Hospital Events   [facility]18: [facility], [facility]  Consults:  PCCM  Procedures:    Significant Diagnostic Tests:  8/18 cth: no acute process  Micro Data:  8/18 sars2: neg 8/18: blood cx: pending 8/18 urine cx: pending 8/18 resp cx: pending  Antimicrobials:  Cefepime 8/18-> vanc 8/18->  Interim history/subjective:  8/18: intubated for airway protection.  8/19: sat performed but pt remains unable to follow commands, agitated but purposeful which is reassuring. Started on propofol to wean off precedex.  8/21: Weaning on pressure support, still on propofol  Objective   Blood pressure (!) 177/65, pulse (!) 107, temperature 99.4 F (37.4 C), temperature source Axillary, resp. rate (!) 32, height 5\' 4"  (1.626 m), weight 96.7 kg, SpO2 92 %.    Vent Mode: PSV;CPAP FiO2 (%):  [30 %-40 %] 40 % Set Rate:  [14 bmp] 14 bmp Vt Set:  [430 mL] 430 mL PEEP:  [5 cmH20] 5 cmH20 Pressure Support:  [10 cmH20] 10 cmH20 Plateau Pressure:  [15 cmH20-22 cmH20] 18 cmH20   Intake/Output Summary (Last 24 hours) at 03/14/2019 0758 Last data filed at 03/14/2019 [id number] Gross per 24 hour  Intake 2055.39 ml  Output 950 ml  Net 1105.39 ml   Filed Weights   03/12/19 0500 03/13/19 0500 03/14/19 0500  Weight: 95.2 kg 95 kg 96.7 kg    Examination: Chronically ill-appearing, not very responsive She is moving all extremities, no purposeful interaction Moist oral mucosa Clear breath sounds  bilaterally S1-S2 appreciated Abdomen is soft, bowel sounds appreciated Extremities shows no clubbing-no edema  Chest x-ray reviewed by myself showing left lower lobe infiltrate, small bilateral effusions  Resolved Hospital Problem list     Assessment & Plan:   Acute respiratory failure: -Wean as tolerated -Continue pressure support as tolerated -She is not ready for extubation-limited by mental status changes  Acute exacerbation of COPD: -Bronchospasms appear to be improving -Bronchodilators as scheduled -Continue empiric antibiotics at present -Probable pneumonia-continue antibiotics  Uncontrolled hypertension -Continue to titrate Cardene  Fever -Continue to follow cultures-results still pending -Follow blood and urine cultures  Altered mentation -Ammonia level of 34 -Continue to wean off sedation as tolerated -Follow clinical status  Transaminitis with hyperbilirubinemia -Abdominal ultrasound did reveal evidence of cirrhosis -Tube feeding as tolerated   History of EtOH use Concern for alcohol withdrawal -Continue to monitor clinically  Chronic normocytic anemia Thrombocytopenia -Likely secondary to chronic alcohol use -Trend CBC  Hyponatremia -Likely secondary to chronic EtOH  Hypokalemia/hypomagnesemia -Replete and follow  Chronic pain -On oxycodone  Still critically ill Mentation still very altered Not a candidate for extubation at present  Best practice:  Diet: Tube feeds, on vital Pain/Anxiety/Delirium protocol (if indicated): propofol VAP protocol (if indicated): per protocol DVT prophylaxis: Heparin GI prophylaxis: Pepcid Glucose control: n/a Mobility: bedrest Code Status: Full code  Family Communication: Pending Disposition: ICU  Labs   CBC: Recent Labs  Lab 03/16/2019 1829 03/12/19 0221 03/13/19 0214 03/14/19 0440  WBC 5.3 6.0 9.6 10.5  HGB 11.8* 11.8* 11.3* 11.3*  HCT 37.6 37.9 35.6* 36.2  MCV 100.3* 100.0 100.0 98.1  PLT  153 110* 123* 989    Basic Metabolic Panel: Recent Labs  Lab 03/24/2019 1829 03/12/19 0221 03/13/19 0214 03/13/19 1038 03/13/19 1700 03/14/19 0440  NA  --  134* 134*  --   --  133*  K  --  3.3* 3.9  --   --  3.5  CL  --  101 101  --   --  101  CO2  --  24 25  --   --  24  GLUCOSE  --  166* 122*  --   --  140*  BUN  --  18 21  --   --  25*  CREATININE 0.73 0.85 0.98  --   --  0.94  CALCIUM  --  7.9* 7.8*  --   --  7.7*  MG  --  1.8 1.7 2.4 2.0 2.2  PHOS  --  3.1 2.6 2.8 2.4* 2.3*   GFR: Estimated Creatinine Clearance: 68.2 mL/min (by C-G formula based on SCr of 0.94 mg/dL). Recent Labs  Lab 02/26/2019 1829 03/12/19 0221 03/12/19 0940 03/13/19 0214 03/14/19 0440  PROCALCITON <0.10  --  <0.10 1.03 0.66  WBC 5.3 6.0  --  9.6 10.5    Liver Function Tests: Recent Labs  Lab 03/12/19 0221 03/13/19 0214 03/14/19 0440  AST 51* 39 33  ALT 46* 44 43  ALKPHOS 90 75 78  BILITOT 1.7* 1.6* 1.7*  PROT 6.2* 5.9* 6.8  ALBUMIN 2.6* 2.4* 2.9*   Recent Labs  Lab 02/25/2019 1829  LIPASE 23  AMYLASE 19*   Recent Labs  Lab 03/12/19 0940  AMMONIA 34    ABG    Component Value Date/Time   PHART 7.372 03/15/2019 1938   PCO2ART 47.3 02/27/2019 1938   PO2ART 337 (H) 03/14/2019 1938   HCO3 26.8 02/26/2019 1938   O2SAT 99.8 03/06/2019 1938     Coagulation Profile: No results for input(s): INR, PROTIME in the last 168 hours.  Cardiac Enzymes: No results for input(s): CKTOTAL, CKMB, CKMBINDEX, TROPONINI in the last 168 hours.  HbA1C: No results found for: HGBA1C  CBG: Recent Labs  Lab 03/13/19 1233 03/13/19 1607 03/13/19 2013 03/13/19 2331 03/14/19 0436  GLUCAP 88 119* 111* 156* 134*   The patient is critically ill with multiple organ system failure and requires high complexity decision making for assessment and support, frequent evaluation and titration of therapies, advanced monitoring, review of radiographic studies and interpretation of complex data.    Critical  Care Time devoted to patient care services, exclusive of separately billable procedures, described in this note is 35 minutes.  Sherrilyn Rist, MD [place], PCCM Cell: [phone]

## 2019-03-14 NOTE — Progress Notes (Signed)
  Progress Note   Date: 03/14/2019  Patient Name: Stephanie Chen        MRN#: [id number]   The diagnosis of sepsis secondary to probable pneumonia was present and has resolved

## 2019-03-14 NOTE — Progress Notes (Signed)
Pulmonary edema related to decompensated diastolic heart failure, chronic Progress Note   Date: 03/14/2019  Patient Name: Stephanie Chen        MRN#: [id number]  Clarification of the diagnosis of pulmonary edema:  Acute pulmonary edema

## 2019-03-14 NOTE — Progress Notes (Signed)
Pharmacy Antibiotic Note  Stephanie Chen is a 63 y.o. female with hx EtOH, COPD and asthma presented to the [facility] on 2019-03-15 with AMS.  She was treated for UTI with ceftriaxone (given on 8/18 at 1p) and EtOH withdrawal with precedex.  She was subsequently transferred to [facility] on 8/18 for further workup and medical management.  To start vancomycin and cefepime for PNA.  - scr 0.7 at [facility]: Day 3 full abx Vanc 2gm x1, then 1gm q24h (adjusted 8/21) AUC 458, rounded scr to 1.0, Vd 0.5 Continue Cefepime 2gm q8 Sputum Cx: normal flora, UCx: reincubated, BCx: ngtd  Height: 5\' 4"  (162.6 cm) Weight: 213 lb 3 oz (96.7 kg) IBW/kg (Calculated) : 54.7  Temp (24hrs), Avg:98.5 F (36.9 C), Min:97.4 F (36.3 C), Max:99.4 F (37.4 C)  Recent Labs  Lab March 15, 2019 1829 03/12/19 0221 03/13/19 0214 03/14/19 0440  WBC 5.3 6.0 9.6 10.5  CREATININE 0.73 0.85 0.98 0.94    Estimated Creatinine Clearance: 68.2 mL/min (by C-G formula based on SCr of 0.94 mg/dL).    No Known Allergies   Antimicrobials this admission:  8/18 CTX x1 at [facility]  8/18 vanc>> 8/18 cefepime>>  Dose adjustments this admission:  8/21 Vanc 1250mg  q24h, AUC 491, SCr 0.8, Vd 0.5, SCr incr to 0.94, adj Vanc to 1gm q24  Microbiology results:  8/18 BCx: ngtd 8/19 UCx: ngtd, reincubated 8/18 Trach asp: rare GPC -normal flora- final 8/18 MRSA PCR: neg  Thank you for allowing pharmacy to be a part of this patient's care.  Minda Ditto 03/14/2019 8:43 AM

## 2019-03-14 NOTE — Progress Notes (Signed)
Patient is being treated with antibiotics for probable pneumonia  Progress Note   Date: 03/14/2019  Patient Name: Stephanie Chen        MRN#: [id number]   Clarification of diagnosis:   Bronchopneumonia CAP

## 2019-03-15 DIAGNOSIS — G9341 Metabolic encephalopathy: Secondary | ICD-10-CM

## 2019-03-15 LAB — GLUCOSE, CAPILLARY
Glucose-Capillary: 112 mg/dL — ABNORMAL HIGH (ref 70–99)
Glucose-Capillary: 117 mg/dL — ABNORMAL HIGH (ref 70–99)
Glucose-Capillary: 124 mg/dL — ABNORMAL HIGH (ref 70–99)
Glucose-Capillary: 133 mg/dL — ABNORMAL HIGH (ref 70–99)
Glucose-Capillary: 135 mg/dL — ABNORMAL HIGH (ref 70–99)
Glucose-Capillary: 136 mg/dL — ABNORMAL HIGH (ref 70–99)
Glucose-Capillary: 150 mg/dL — ABNORMAL HIGH (ref 70–99)

## 2019-03-15 LAB — CBC
HCT: 35.7 % — ABNORMAL LOW (ref 36.0–46.0)
Hemoglobin: 10.9 g/dL — ABNORMAL LOW (ref 12.0–15.0)
MCH: 31 pg (ref 26.0–34.0)
MCHC: 30.5 g/dL (ref 30.0–36.0)
MCV: 101.4 fL — ABNORMAL HIGH (ref 80.0–100.0)
Platelets: 132 10*3/uL — ABNORMAL LOW (ref 150–400)
RBC: 3.52 MIL/uL — ABNORMAL LOW (ref 3.87–5.11)
RDW: 16.2 % — ABNORMAL HIGH (ref 11.5–15.5)
WBC: 9.3 10*3/uL (ref 4.0–10.5)
nRBC: 0 % (ref 0.0–0.2)

## 2019-03-15 LAB — MAGNESIUM: Magnesium: 2.4 mg/dL (ref 1.7–2.4)

## 2019-03-15 LAB — CREATININE, SERUM
Creatinine, Ser: 0.94 mg/dL (ref 0.44–1.00)
GFR calc Af Amer: >60 mL/min (ref >60)
GFR calc non Af Amer: >60 mL/min (ref >60)

## 2019-03-15 LAB — PHOSPHORUS: Phosphorus: 3.4 mg/dL (ref 2.5–4.6)

## 2019-03-15 LAB — TRIGLYCERIDES: Triglycerides: 56 mg/dL (ref <150)

## 2019-03-15 MED ORDER — FLUOXETINE HCL 20 MG PO CAPS
20.0000 mg | ORAL_CAPSULE | Freq: Every day | ORAL | Status: DC
  Administered 2019-03-15: 12:00:00 20 mg via ORAL
  Filled 2019-03-15: qty 1

## 2019-03-15 MED ORDER — BUPROPION HCL 100 MG PO TABS
100.0000 mg | ORAL_TABLET | Freq: Three times a day (TID) | ORAL | Status: DC
  Administered 2019-03-15 – 2019-03-16 (×6): 100 mg via ORAL
  Filled 2019-03-15 (×7): qty 1

## 2019-03-15 MED ORDER — FENTANYL CITRATE (PF) 100 MCG/2ML IJ SOLN
INTRAMUSCULAR | Status: AC
  Filled 2019-03-15: qty 2

## 2019-03-15 MED ORDER — OXYCODONE HCL 5 MG PO TABS
10.0000 mg | ORAL_TABLET | Freq: Four times a day (QID) | ORAL | Status: DC
  Administered 2019-03-15 – 2019-03-16 (×8): 10 mg via ORAL
  Filled 2019-03-15 (×8): qty 2

## 2019-03-15 MED ORDER — AMLODIPINE BESYLATE 10 MG PO TABS
10.0000 mg | ORAL_TABLET | Freq: Every day | ORAL | Status: DC
  Administered 2019-03-15 – 2019-03-21 (×7): 10 mg
  Filled 2019-03-15 (×7): qty 1

## 2019-03-15 NOTE — Progress Notes (Addendum)
NAME:  Stephanie SkeenSusan M Chen, MRN:  [id number], DOB:  [birth year], LOS: 4 ADMISSION DATE:  2019/01/08, CONSULTATION DATE:  07/15/2019 REFERRING MD:  [facility], CHIEF COMPLAINT:  ETOH withdrawal  Brief History   63 yo female with acute etoh withdrawal and possible sepsis  63 yo with pmh copd, and etoh abuse who prsented with ams to osh. Per limited records sent (pt unresponsive so all history is obtained from chart review). Pt has been confused for 2 weeks. Reportedly was seen by her pcp for the same confusion but it is unclear what occurred after that visit. Daughter had reported pt was having hallucinations, multiple falls and increased weakness. Over the past 7 days she has been utilizing her inhalers more frequently, although husband denies this.   Upon presentation to osh she was tremulous and concern over DT with acute etoh withdraw was raised. She was given multiple doses of ativan/valium and subsequently started on precedex. Husband via the phone states that she "may drink 2-3 beers a day" last drink was yesterday afternoon possibly (he is unsure as he was at work). He is sure she did not drink anything last pm however as she was restless and "so confused" . She has been progressively worsen over the past 24 hours and not sleeping well for the past 2 days. Denies sick contacts, fevers/chills/cough. She has been taking her regularly prescribed medications.   Upon arrival to our ICU pt is poorly responsive, not protecting airway and abdominally breathing. She is febrile but hemodynamically stable at this time.   Past Medical History   Past Medical History:  Diagnosis Date  . Arthritis   . Asthma   . COPD (chronic obstructive pulmonary disease) (HCC)   . Fibromyalgia   . Pneumonia   . Shortness of breath dyspnea     Significant Hospital Events   [facility]18[facility]  Consults:  PCCM  Procedures:    Significant Diagnostic Tests:  8/18 CT head IMPRESSION: No  acute intracranial abnormality.  8/21 MRI IMPRESSION: 1. No acute finding including evidence of posterior reversible encephalopathy syndrome. 2. Moderate chronic small vessel ischemia. Micro Data:  8/18 sars2: neg 8/18: blood cx: pending 8/18 urine cx: Streptococcus viridans 8/18 resp cx: pending  Antimicrobials:  Cefepime 8/18-> vanc 8/18->  Interim history/subjective:  8/18: intubated for airway protection.  8/19: sat performed but pt remains unable to follow commands, agitated but purposeful which is reassuring. Started on propofol to wean off precedex.  8/21: Weaning on pressure support, still on propofol  Objective   Blood pressure (!) 181/65, pulse 84, temperature 98.6 F (37 C), temperature source Oral, resp. rate 20, height 5\' 4"  (1.626 m), weight 96 kg, SpO2 96 %.    Vent Mode: PRVC FiO2 (%):  [10 %-30 %] 30 % Set Rate:  [14 bmp] 14 bmp Vt Set:  [430 mL] 430 mL PEEP:  [5 cmH20] 5 cmH20 Pressure Support:  [10 cmH20] 10 cmH20 Plateau Pressure:  [13 cmH20-18 cmH20] 13 cmH20   Intake/Output Summary (Last 24 hours) at 03/15/2019 0850 Last data filed at 03/15/2019 0840 Gross per 24 hour  Intake 3306.93 ml  Output 600 ml  Net 2706.93 ml   Filed Weights   03/13/19 0500 03/14/19 0500 03/15/19 0500  Weight: 95 kg 96.7 kg 96 kg    Examination:  Chronically ill-appearing, she does respond to name calling by trying to open her eyes but does not follow commands Moving all extremities, no purposeful interaction  Moist oral mucosa Decreased breath sounds at the bases S1-S2 appreciated Abdomen is soft, bowel sounds appreciated Extremities shows no clubbing Some mottling of the skin-improved compared with yesterday  Chest x-ray 8/21 2020-left lower lobe infiltrate, small bilateral pleural effusions   Resolved Hospital Problem list     Assessment & Plan:   Encephalopathy -Negative CT scan of the chest -Negative MRI -Ammonia is normal, LFTs improved compared with  presentation -May be multifactorial  Acute respiratory failure -Wean as tolerated -Pressure support as tolerated -Not ready for extubation, limited by mental status  Acute exacerbation of COPD -Continue bronchodilator support -Probable pneumonia, on antibiotics  Uncontrolled hypertension -Continue titrate Cardene -Will start on calcium channel blocker  Fever -Strep viridans in urine -Follow blood and urine cultures -No longer having fevers -Obtain MRSA PCR, if negative-we will switch to cefuroxime, discontinue vancomycin  Transaminitis with hyperbilirubinemia -Abdominal ultrasound did reveal evidence of cirrhosis -Continue tube feeds  History of EtOH use -Continue to monitor clinically -There was initial concern for alcohol withdrawal, however, some time has passed already  Chronic pain -On oxycodone -She squirms around a block with attempts to wean -She is chronically on OxyContin 40 and Percocets 10 -We will start on OxyIR 10 every 6 -There may be a component of pain limiting weaning as well  She remains critically ill Mentation still significantly on altered Not a candidate for extubation  She was on Wellbutrin at home -We will resume Wellbutrin -Switch to a stimulating antidepressant  Best practice:  Diet: Continue vital Pain/Anxiety/Delirium protocol (if indicated): Propofol, optimize opiates VAP protocol (if indicated): per protocol DVT prophylaxis: Heparin GI prophylaxis: Sedated Glucose control: n/a Mobility: bedrest Code Status: Full code  Family Communication: Discussed with husband at bedside on 03/14/2019 Disposition: ICU  Labs   CBC: Recent Labs  Lab 01/13/2019 1829 03/12/19 0221 03/13/19 0214 03/14/19 0440 03/15/19 0417  WBC 5.3 6.0 9.6 10.5 9.3  HGB 11.8* 11.8* 11.3* 11.3* 10.9*  HCT 37.6 37.9 35.6* 36.2 35.7*  MCV 100.3* 100.0 100.0 98.1 101.4*  PLT 153 110* 123* 167 132*    Basic Metabolic Panel: Recent Labs  Lab 01/13/2019 1829   03/12/19 0221 03/13/19 0214 03/13/19 1038 03/13/19 1700 03/14/19 0440 03/14/19 1714 03/15/19 0417  NA  --   --  134* 134*  --   --  133*  --   --   K  --   --  3.3* 3.9  --   --  3.5  --   --   CL  --   --  101 101  --   --  101  --   --   CO2  --   --  24 25  --   --  24  --   --   GLUCOSE  --   --  166* 122*  --   --  140*  --   --   BUN  --   --  18 21  --   --  25*  --   --   CREATININE 0.73  --  0.85 0.98  --   --  0.94  --  0.94  CALCIUM  --   --  7.9* 7.8*  --   --  7.7*  --   --   MG  --    < > 1.8 1.7 2.4 2.0 2.2 2.2 2.4  PHOS  --    < > 3.1 2.6 2.8 2.4* 2.3* 3.1 3.4   < > =  values in this interval not displayed.   GFR: Estimated Creatinine Clearance: 68 mL/min (by C-G formula based on SCr of 0.94 mg/dL). Recent Labs  Lab 2019/03/30 1829 03/12/19 0221 03/12/19 0940 03/13/19 0214 03/14/19 0440 03/15/19 0417  PROCALCITON <0.10  --  <0.10 1.03 0.66  --   WBC 5.3 6.0  --  9.6 10.5 9.3    Liver Function Tests: Recent Labs  Lab 03/12/19 0221 03/13/19 0214 03/14/19 0440  AST 51* 39 33  ALT 46* 44 43  ALKPHOS 90 75 78  BILITOT 1.7* 1.6* 1.7*  PROT 6.2* 5.9* 6.8  ALBUMIN 2.6* 2.4* 2.9*   Recent Labs  Lab 2019/03/30 1829  LIPASE 23  AMYLASE 19*   Recent Labs  Lab 03/12/19 0940  AMMONIA 34    ABG    Component Value Date/Time   PHART 7.372 03-30-19 1938   PCO2ART 47.3 03/30/2019 1938   PO2ART 337 (H) 03-30-19 1938   HCO3 26.8 03/30/19 1938   O2SAT 99.8 03/30/19 1938     Coagulation Profile: No results for input(s): INR, PROTIME in the last 168 hours.  Cardiac Enzymes: No results for input(s): CKTOTAL, CKMB, CKMBINDEX, TROPONINI in the last 168 hours.  HbA1C: No results found for: HGBA1C  CBG: Recent Labs  Lab 03/13/19 2331 03/14/19 0436 03/14/19 0740 03/14/19 1129 03/14/19 1559  GLUCAP 156* 134* 151* 148* 126*   The patient is critically ill with multiple organ systems failure and requires high complexity decision making for  assessment and support, frequent evaluation and titration of therapies, application of advanced monitoring technologies and extensive interpretation of multiple databases. Critical Care Time devoted to patient care services described in this note independent of APP/resident time (if applicable)  is 40 minutes.   Sherrilyn Rist MD [facility] Personal pager: [phone] If unanswered, please page CCM On-call: [phone]

## 2019-03-15 NOTE — Progress Notes (Signed)
Pt only had 300 urine output all day, and only urinated when we turned her. RN bladder scanned her and it showed >360, RN paged Dr. Ander Slade and he gave orders for I&O cath. RN did the I&O cath and got 850 out. RN reported results to Dr. Ander Slade and he said if in 6 hours pt is still retaining, to go ahead and put a foley in. RN will pass information along to night shift RN.

## 2019-03-16 ENCOUNTER — Inpatient Hospital Stay (HOSPITAL_COMMUNITY): Payer: Commercial Managed Care - PPO

## 2019-03-16 DIAGNOSIS — J9611 Chronic respiratory failure with hypoxia: Secondary | ICD-10-CM

## 2019-03-16 DIAGNOSIS — J449 Chronic obstructive pulmonary disease, unspecified: Secondary | ICD-10-CM

## 2019-03-16 LAB — CBC WITH DIFFERENTIAL/PLATELET
Abs Immature Granulocytes: 0.16 10*3/uL — ABNORMAL HIGH (ref 0.00–0.07)
Basophils Absolute: 0 10*3/uL (ref 0.0–0.1)
Basophils Relative: 0 %
Eosinophils Absolute: 0 10*3/uL (ref 0.0–0.5)
Eosinophils Relative: 0 %
HCT: 39.4 % (ref 36.0–46.0)
Hemoglobin: 12 g/dL (ref 12.0–15.0)
Immature Granulocytes: 1 %
Lymphocytes Relative: 4 %
Lymphs Abs: 0.6 10*3/uL — ABNORMAL LOW (ref 0.7–4.0)
MCH: 31.4 pg (ref 26.0–34.0)
MCHC: 30.5 g/dL (ref 30.0–36.0)
MCV: 103.1 fL — ABNORMAL HIGH (ref 80.0–100.0)
Monocytes Absolute: 1 10*3/uL (ref 0.1–1.0)
Monocytes Relative: 6 %
Neutro Abs: 13.6 10*3/uL — ABNORMAL HIGH (ref 1.7–7.7)
Neutrophils Relative %: 89 %
Platelets: 161 10*3/uL (ref 150–400)
RBC: 3.82 MIL/uL — ABNORMAL LOW (ref 3.87–5.11)
RDW: 15.8 % — ABNORMAL HIGH (ref 11.5–15.5)
WBC: 15.4 10*3/uL — ABNORMAL HIGH (ref 4.0–10.5)
nRBC: 0 % (ref 0.0–0.2)

## 2019-03-16 LAB — BLOOD GAS, ARTERIAL
Acid-base deficit: 1.8 mmol/L (ref 0.0–2.0)
Acid-base deficit: 2 mmol/L (ref 0.0–2.0)
Bicarbonate: 24.1 mmol/L (ref 20.0–28.0)
Bicarbonate: 24 mmol/L (ref 20.0–28.0)
Drawn by: 441261
FIO2: 30
FIO2: 60
O2 Saturation: 76.3 %
O2 Saturation: 90.4 %
PEEP: 5 cmH2O
Patient temperature: 98.6
Patient temperature: 98.6
Pressure support: 5 cmH2O
pCO2 arterial: 48.4 mmHg — ABNORMAL HIGH (ref 32.0–48.0)
pCO2 arterial: 49 mmHg — ABNORMAL HIGH (ref 32.0–48.0)
pH, Arterial: 7.318 — ABNORMAL LOW (ref 7.350–7.450)
pH, Arterial: 7.311 — ABNORMAL LOW (ref 7.350–7.450)
pO2, Arterial: 45.9 mmHg — ABNORMAL LOW (ref 83.0–108.0)
pO2, Arterial: 64.9 mmHg — ABNORMAL LOW (ref 83.0–108.0)

## 2019-03-16 LAB — CULTURE, BLOOD (ROUTINE X 2)
Culture: NO GROWTH
Culture: NO GROWTH
Special Requests: ADEQUATE
Special Requests: ADEQUATE

## 2019-03-16 LAB — COMPREHENSIVE METABOLIC PANEL
ALT: 44 U/L (ref 0–44)
AST: 30 U/L (ref 15–41)
Albumin: 3.2 g/dL — ABNORMAL LOW (ref 3.5–5.0)
Alkaline Phosphatase: 85 U/L (ref 38–126)
Anion gap: 9 (ref 5–15)
BUN: 54 mg/dL — ABNORMAL HIGH (ref 8–23)
CO2: 23 mmol/L (ref 22–32)
Calcium: 8 mg/dL — ABNORMAL LOW (ref 8.9–10.3)
Chloride: 104 mmol/L (ref 98–111)
Creatinine, Ser: 1.12 mg/dL — ABNORMAL HIGH (ref 0.44–1.00)
GFR calc Af Amer: >60 mL/min (ref >60)
GFR calc non Af Amer: 52 mL/min — ABNORMAL LOW (ref >60)
Glucose, Bld: 172 mg/dL — ABNORMAL HIGH (ref 70–99)
Potassium: 4.7 mmol/L (ref 3.5–5.1)
Sodium: 136 mmol/L (ref 135–145)
Total Bilirubin: 1.3 mg/dL — ABNORMAL HIGH (ref 0.3–1.2)
Total Protein: 7.6 g/dL (ref 6.5–8.1)

## 2019-03-16 LAB — GLUCOSE, CAPILLARY
Glucose-Capillary: 116 mg/dL — ABNORMAL HIGH (ref 70–99)
Glucose-Capillary: 120 mg/dL — ABNORMAL HIGH (ref 70–99)
Glucose-Capillary: 124 mg/dL — ABNORMAL HIGH (ref 70–99)
Glucose-Capillary: 134 mg/dL — ABNORMAL HIGH (ref 70–99)
Glucose-Capillary: 146 mg/dL — ABNORMAL HIGH (ref 70–99)
Glucose-Capillary: 151 mg/dL — ABNORMAL HIGH (ref 70–99)
Glucose-Capillary: 154 mg/dL — ABNORMAL HIGH (ref 70–99)

## 2019-03-16 LAB — CREATININE, SERUM
Creatinine, Ser: 1.02 mg/dL — ABNORMAL HIGH (ref 0.44–1.00)
GFR calc Af Amer: >60 mL/min (ref >60)
GFR calc non Af Amer: 58 mL/min — ABNORMAL LOW (ref >60)

## 2019-03-16 MED ORDER — SODIUM CHLORIDE 0.9 % IV SOLN: INTRAVENOUS | Status: AC

## 2019-03-16 MED ORDER — FLUOXETINE HCL 20 MG/5ML PO SOLN
20.0000 mg | Freq: Every day | ORAL | Status: DC
  Administered 2019-03-16 – 2019-03-21 (×6): 20 mg
  Filled 2019-03-16 (×6): qty 5

## 2019-03-16 MED ORDER — DEXMEDETOMIDINE HCL IN NACL 200 MCG/50ML IV SOLN
0.4000 ug/kg/h | INTRAVENOUS | Status: DC
  Administered 2019-03-16: 19:00:00 0.4 ug/kg/h via INTRAVENOUS
  Administered 2019-03-16: 10:00:00 1 ug/kg/h via INTRAVENOUS
  Administered 2019-03-16: 23:00:00 0.8 ug/kg/h via INTRAVENOUS
  Administered 2019-03-17: 03:00:00 0.5 ug/kg/h via INTRAVENOUS
  Administered 2019-03-17 (×2): 0.6 ug/kg/h via INTRAVENOUS
  Administered 2019-03-17: 06:00:00 0.4 ug/kg/h via INTRAVENOUS
  Administered 2019-03-17: 0.8 ug/kg/h via INTRAVENOUS
  Filled 2019-03-16 (×7): qty 50

## 2019-03-16 MED ORDER — SODIUM CHLORIDE 0.9 % IV SOLN
1.0000 g | INTRAVENOUS | Status: DC
  Administered 2019-03-16 – 2019-03-19 (×4): 1 g via INTRAVENOUS
  Filled 2019-03-16 (×4): qty 1

## 2019-03-16 MED ORDER — METOPROLOL TARTRATE 25 MG/10 ML ORAL SUSPENSION
25.0000 mg | Freq: Two times a day (BID) | ORAL | Status: DC
  Administered 2019-03-16: 25 mg via ORAL
  Filled 2019-03-16: qty 10

## 2019-03-16 NOTE — Progress Notes (Signed)
NAME:  Stephanie SkeenSusan M Chen, MRN:  [id number], DOB:  03/02/1956, LOS: 5 ADMISSION DATE:  03/10/2019, CONSULTATION DATE:  03/07/2019 REFERRING MD:  OS[facility], CHIEF COMPLAINT:  ETOH withdrawal  Brief History   63 yo female with acute etoh withdrawal and possible sepsis  63 yo with pmh copd, and etoh abuse who prsented with ams to osh. Per limited records sent (pt unresponsive so all history is obtained from chart review). Pt has been confused for 2 weeks. Reportedly was seen by her pcp for the same confusion but it is unclear what occurred after that visit. Daughter had reported pt was having hallucinations, multiple falls and increased weakness. Over the past 7 days she has been utilizing her inhalers more frequently, although husband denies this.   Upon presentation to osh she was tremulous and concern over DT with acute etoh withdraw was raised. She was given multiple doses of ativan/valium and subsequently started on precedex. Husband via the phone states that she "may drink 2-3 beers a day" last drink was yesterday afternoon possibly (he is unsure as he was at work). He is sure she did not drink anything last pm however as she was restless and "so confused" . She has been progressively worsen over the past 24 hours and not sleeping well for the past 2 days. Denies sick contacts, fevers/chills/cough. She has been taking her regularly prescribed medications.   Upon arrival to our ICU pt is poorly responsive, not protecting airway and abdominally breathing. She is febrile but hemodynamically stable at this time.   Past Medical History   Past Medical History:  Diagnosis Date  . Arthritis   . Asthma   . COPD (chronic obstructive pulmonary disease) (HCC)   . Fibromyalgia   . Pneumonia   . Shortness of breath dyspnea     Significant Hospital Events   [facility]18: [facility] stable 8/23 remains encephalopathic Consults:  PCCM  Procedures:    Significant Diagnostic Tests:  8/18  CT head IMPRESSION: No acute intracranial abnormality.  8/21 MRI IMPRESSION: 1. No acute finding including evidence of posterior reversible encephalopathy syndrome. 2. Moderate chronic small vessel ischemia.  Micro Data:  8/18 sars2: neg 8/18: blood cx: pending 8/18 urine cx: Streptococcus viridans 8/18 resp cx: pending  Antimicrobials:  Cefepime 8/18-> vanc 8/18-8/22  Interim history/subjective:  8/18: intubated for airway protection.  8/19: sat performed but pt remains unable to follow commands, agitated but purposeful which is reassuring. Started on propofol to wean off precedex.  8/21: Weaning on pressure support, still on propofol  Objective   Blood pressure (!) 174/67, pulse 94, temperature 98.2 F (36.8 C), temperature source Axillary, resp. rate (!) 21, height 5\' 4"  (1.626 m), weight 100 kg, SpO2 93 %.    Vent Mode: PRVC FiO2 (%):  [30 %] 30 % Set Rate:  [14 bmp] 14 bmp Vt Set:  [430 mL] 430 mL PEEP:  [5 cmH20] 5 cmH20 Plateau Pressure:  [15 cmH20-16 cmH20] 15 cmH20   Intake/Output Summary (Last 24 hours) at 03/16/2019 0847 Last data filed at 03/16/2019 0700 Gross per 24 hour  Intake 5043.67 ml  Output 1650 ml  Net 3393.67 ml   Filed Weights   03/14/19 0500 03/15/19 0500 03/16/19 0400  Weight: 96.7 kg 96 kg 100 kg    Examination:  Chronically ill-appearing, she will open her eyes to noise Does not follow commands, nonpurposeful interaction Still writhing in bed Moist oromucosa-increase secretions Decreased breath sounds at the bases S1-S2  appreciated Abdomen is soft, bowel sounds appreciated Extremities shows no clubbing, skin mottling-improved   Chest x-ray 8/23 2020-left lower lobe infiltrate, small bilateral pleural effusions-hypoventilated lung fields   Resolved Hospital Problem list     Assessment & Plan:   Encephalopathy -Negative CT scan of the head, negative MRI -Ammonia is normal -LFTs improving -Likely multifactorial Repeat blood  cultures Obtained arterial blood gas We will consult neurology -Switch to Precedex from propofol -Order EEG  Acute respiratory failure -Wean as tolerated -Pressure support as tolerated -Not ready for extubation, limited by mental status  Hypercapnia/hypoxemia -Ventilator changes made -Follow-up repeat ABG  Chronic obstructive pulmonary disease -Continue bronchodilator support -Probable pneumonia, was on vancomycin and cefepime -will de-escalate to cefuroxime, discontinue vancomycin  Uncontrolled hypertension -Wean off Cardene -Continue Norvasc -Added metoprolol  Fever -Strep viridans in urine -Probable pneumonia -No longer having fevers -Continue to follow cultures, blood cultures been repeated today  Transaminitis with hyperbilirubinemia -Labs did improve -Continue tube feeds  History of EtOH use -There was initial concern for withdrawal, however, has been in hospital for 6 days now, withdrawal should no longer be a factor  Chronic pain/fibromyalgia/depression -Was on high doses of oxycodone at home -Reinitiated oxycodone at a lower dose -Stimulating antidepressants initiated  She remains critically ill Remains encephalopathic She is not showing any signs of seizures  Consult neurology Order EEG  Best practice:  Diet: Continue vital Pain/Anxiety/Delirium protocol (if indicated): Optimize opiates, switch over to Precedex VAP protocol (if indicated): per protocol DVT prophylaxis: Heparin GI prophylaxis:  Glucose control: n/a Mobility: bedrest Code Status: Full code  Family Communication: Discussed with husband at bedside on 03/14/2019 Disposition: ICU  Labs   CBC: Recent Labs  Lab 02/23/2019 [place] 03/12/19 0221 03/13/19 0214 03/14/19 0440 03/15/19 0417  WBC 5.3 6.0 9.6 10.5 9.3  HGB 11.8* 11.8* 11.3* 11.3* 10.9*  HCT 37.6 37.9 35.6* 36.2 35.7*  MCV 100.3* 100.0 100.0 98.1 101.4*  PLT 153 110* 123* 167 132*    Basic Metabolic Panel: Recent Labs   Lab 03/12/19 0221 03/13/19 0214 03/13/19 1038 03/13/19 1700 03/14/19 0440 03/14/19 1714 03/15/19 0417 03/16/19 0446  NA 134* 134*  --   --  133*  --   --   --   K 3.3* 3.9  --   --  3.5  --   --   --   CL 101 101  --   --  101  --   --   --   CO2 24 25  --   --  24  --   --   --   GLUCOSE 166* 122*  --   --  140*  --   --   --   BUN 18 21  --   --  25*  --   --   --   CREATININE 0.85 0.98  --   --  0.94  --  0.94 1.02*  CALCIUM 7.9* 7.8*  --   --  7.7*  --   --   --   MG 1.8 1.7 2.4 2.0 2.2 2.2 2.4  --   PHOS 3.1 2.6 2.8 2.4* 2.3* 3.1 3.4  --    GFR: Estimated Creatinine Clearance: 64 mL/min (A) (by C-G formula based on SCr of 1.02 mg/dL (H)). Recent Labs  Lab 03/09/2019 1829 03/12/19 0221 03/12/19 0940 03/13/19 0214 03/14/19 0440 03/15/19 0417  PROCALCITON <0.10  --  <0.10 1.03 0.66  --   WBC 5.3 6.0  --  9.6  10.5 9.3    Liver Function Tests: Recent Labs  Lab 03/12/19 0221 03/13/19 0214 03/14/19 0440  AST 51* 39 33  ALT 46* 44 43  ALKPHOS 90 75 78  BILITOT 1.7* 1.6* 1.7*  PROT 6.2* 5.9* 6.8  ALBUMIN 2.6* 2.4* 2.9*   Recent Labs  Lab 2019-02-28 1829  LIPASE 23  AMYLASE 19*   Recent Labs  Lab 03/12/19 0940  AMMONIA 34    ABG    Component Value Date/Time   PHART 7.372 2020-02-719 1938   PCO2ART 47.3 2020-02-719 1938   PO2ART 337 (H) 2020-02-719 1938   HCO3 26.8 2020-02-719 1938   O2SAT 99.8 2020-02-719 1938     Coagulation Profile: No results for input(s): INR, PROTIME in the last 168 hours.  Cardiac Enzymes: No results for input(s): CKTOTAL, CKMB, CKMBINDEX, TROPONINI in the last 168 hours.  HbA1C: No results found for: HGBA1C  CBG: Recent Labs  Lab 03/15/19 1124 03/15/19 1548 03/15/19 2347 03/16/19 0410 03/16/19 0724  GLUCAP 136* 117* 150* 146* 151*   The patient is critically ill with multiple organ systems failure and requires high complexity decision making for assessment and support, frequent evaluation and titration of therapies,  application of advanced monitoring technologies and extensive interpretation of multiple databases. Critical Care Time devoted to patient care services described in this note independent of APP/resident time (if applicable)  is 40 minutes.   Virl DiamondAdewale Yossi Hinchman MD [facility] Personal pager: [phone] If unanswered, please page CCM On-call: #[phone]

## 2019-03-16 NOTE — Progress Notes (Signed)
ABG results on 500/14/60%/+10 as followed  7.311/49.0/64.9/24.0/90%. Patient to remain on +10 and 60% at this time; will wean as tolerated by patient. RT will continue to monitor patient.

## 2019-03-16 NOTE — Progress Notes (Signed)
Pt has not had any urine out since shift change at 7pm, bladder scan showed 236ml. Did in and out cath and got 473ml dark yellow urine. Will continue to monitor

## 2019-03-17 ENCOUNTER — Encounter (HOSPITAL_COMMUNITY): Payer: Self-pay | Admitting: Neurology

## 2019-03-17 ENCOUNTER — Inpatient Hospital Stay (HOSPITAL_COMMUNITY)
Admission: AD | Admit: 2019-03-17 | Discharge: 2019-03-17 | Disposition: A | Discharge status: Home / Self Care | Payer: Commercial Managed Care - PPO | Source: Other Acute Inpatient Hospital | Attending: Pulmonary Disease | Admitting: Pulmonary Disease

## 2019-03-17 DIAGNOSIS — R569 Unspecified convulsions: Secondary | ICD-10-CM

## 2019-03-17 DIAGNOSIS — G9349 Other encephalopathy: Secondary | ICD-10-CM

## 2019-03-17 DIAGNOSIS — G8929 Other chronic pain: Secondary | ICD-10-CM

## 2019-03-17 DIAGNOSIS — R4182 Altered mental status, unspecified: Secondary | ICD-10-CM

## 2019-03-17 LAB — BLOOD GAS, ARTERIAL
Acid-base deficit: 1.3 mmol/L (ref 0.0–2.0)
Bicarbonate: 24.3 mmol/L (ref 20.0–28.0)
Drawn by: 514251
FIO2: 60
MECHVT: 500 mL
O2 Saturation: 94.2 %
PEEP: 10 cmH2O
Patient temperature: 98.6
RATE: 14 resp/min
pCO2 arterial: 47.8 mmHg (ref 32.0–48.0)
pH, Arterial: 7.327 — ABNORMAL LOW (ref 7.350–7.450)
pO2, Arterial: 77.9 mmHg — ABNORMAL LOW (ref 83.0–108.0)

## 2019-03-17 LAB — APTT: aPTT: 29 seconds (ref 24–36)

## 2019-03-17 LAB — CBC
HCT: 36.8 % (ref 36.0–46.0)
Hemoglobin: 11.1 g/dL — ABNORMAL LOW (ref 12.0–15.0)
MCH: 31.1 pg (ref 26.0–34.0)
MCHC: 30.2 g/dL (ref 30.0–36.0)
MCV: 103.1 fL — ABNORMAL HIGH (ref 80.0–100.0)
Platelets: 117 10*3/uL — ABNORMAL LOW (ref 150–400)
RBC: 3.57 MIL/uL — ABNORMAL LOW (ref 3.87–5.11)
RDW: 15.9 % — ABNORMAL HIGH (ref 11.5–15.5)
WBC: 13.2 10*3/uL — ABNORMAL HIGH (ref 4.0–10.5)
nRBC: 0 % (ref 0.0–0.2)

## 2019-03-17 LAB — HEPATIC FUNCTION PANEL
ALT: 46 U/L — ABNORMAL HIGH (ref 0–44)
AST: 36 U/L (ref 15–41)
Albumin: 2.5 g/dL — ABNORMAL LOW (ref 3.5–5.0)
Alkaline Phosphatase: 61 U/L (ref 38–126)
Bilirubin, Direct: 0.6 mg/dL — ABNORMAL HIGH (ref 0.0–0.2)
Indirect Bilirubin: 0.4 mg/dL (ref 0.3–0.9)
Total Bilirubin: 1 mg/dL (ref 0.3–1.2)
Total Protein: 6.2 g/dL — ABNORMAL LOW (ref 6.5–8.1)

## 2019-03-17 LAB — PROTIME-INR
INR: 1.1 (ref 0.8–1.2)
Prothrombin Time: 14 seconds (ref 11.4–15.2)

## 2019-03-17 LAB — BASIC METABOLIC PANEL
Anion gap: 10 (ref 5–15)
BUN: 63 mg/dL — ABNORMAL HIGH (ref 8–23)
CO2: 21 mmol/L — ABNORMAL LOW (ref 22–32)
Calcium: 7.8 mg/dL — ABNORMAL LOW (ref 8.9–10.3)
Chloride: 108 mmol/L (ref 98–111)
Creatinine, Ser: 1.24 mg/dL — ABNORMAL HIGH (ref 0.44–1.00)
GFR calc Af Amer: 53 mL/min — ABNORMAL LOW (ref >60)
GFR calc non Af Amer: 46 mL/min — ABNORMAL LOW (ref >60)
Glucose, Bld: 158 mg/dL — ABNORMAL HIGH (ref 70–99)
Potassium: 4.3 mmol/L (ref 3.5–5.1)
Sodium: 139 mmol/L (ref 135–145)

## 2019-03-17 LAB — AMMONIA: Ammonia: 42 umol/L — ABNORMAL HIGH (ref 9–35)

## 2019-03-17 LAB — GLUCOSE, CAPILLARY
Glucose-Capillary: 124 mg/dL — ABNORMAL HIGH (ref 70–99)
Glucose-Capillary: 93 mg/dL (ref 70–99)
Glucose-Capillary: 101 mg/dL — ABNORMAL HIGH (ref 70–99)
Glucose-Capillary: 102 mg/dL — ABNORMAL HIGH (ref 70–99)
Glucose-Capillary: 101 mg/dL — ABNORMAL HIGH (ref 70–99)
Glucose-Capillary: 89 mg/dL (ref 70–99)

## 2019-03-17 MED ORDER — OXYCODONE HCL 5 MG PO TABS
10.0000 mg | ORAL_TABLET | Freq: Four times a day (QID) | ORAL | Status: DC
  Administered 2019-03-17 – 2019-03-20 (×16): 10 mg
  Filled 2019-03-17 (×16): qty 2

## 2019-03-17 MED ORDER — VITAMIN B-1 100 MG PO TABS
100.0000 mg | ORAL_TABLET | Freq: Every day | ORAL | Status: DC
  Administered 2019-03-18 – 2019-03-26 (×9): 100 mg
  Filled 2019-03-17 (×9): qty 1

## 2019-03-17 MED ORDER — BUPROPION HCL 100 MG PO TABS
100.0000 mg | ORAL_TABLET | Freq: Three times a day (TID) | ORAL | Status: DC
  Administered 2019-03-17 – 2019-03-21 (×13): 100 mg
  Filled 2019-03-17 (×13): qty 1

## 2019-03-17 MED ORDER — FUROSEMIDE 10 MG/ML IJ SOLN
20.0000 mg | Freq: Once | INTRAMUSCULAR | Status: AC
  Administered 2019-03-17: 20 mg via INTRAVENOUS
  Filled 2019-03-17: qty 2

## 2019-03-17 MED ORDER — MIDAZOLAM 50MG/50ML (1MG/ML) PREMIX INFUSION
0.5000 mg/h | INTRAVENOUS | Status: DC
  Administered 2019-03-17: 20:00:00 0.5 mg/h via INTRAVENOUS
  Filled 2019-03-17: qty 50

## 2019-03-17 MED ORDER — HEPARIN SODIUM (PORCINE) 5000 UNIT/ML IJ SOLN
5000.0000 [IU] | Freq: Two times a day (BID) | INTRAMUSCULAR | Status: DC
  Administered 2019-03-17 – 2019-03-26 (×18): 5000 [IU] via SUBCUTANEOUS
  Filled 2019-03-17 (×18): qty 1

## 2019-03-17 NOTE — Consult Note (Addendum)
Neurology Consultation  Reason for Consult: Confusion and writhing Referring Physician: Dr. Laruth Bouchard  History is obtained from: Complete history from chart as patient is intubated and sedated  HPI: Stephanie Chen is a 63 y.o. female with history of S OB, pneumonia, fibromyalgia, asthma.  Patient is also on a lot of medications.  Such medications that could cause confusion include oxycodone, Wellbutrin, Cymbalta, Valium, oxycodone 40 mg, oxycodone 10-35 mg to be taken 1 tab every 6 hours as needed, Lyrica 75 mg.  It is stated per husband that she drinks 3 beers a day however the total amount is not quite known.  Patient has been confused for approximately 2 weeks.  Per notes daughter had reported she was having hallucinations, and multiple falls over the past 2 weeks.  Upon arrival to the ICU due to protecting her airway she was intubated.  Throughout hospitalization she has a been afebrile.   ROS:  Unable to obtain due to altered mental status.   Past Medical History:  Diagnosis Date  . Arthritis   . Asthma   . COPD (chronic obstructive pulmonary disease) ([place])   . Fibromyalgia   . Pneumonia   . Shortness of breath dyspnea      Family History  Problem Relation Age of Onset  . Hypertension Mother   . Hypertension Father    Social History:   reports that she has been smoking cigarettes. She has a 45.00 pack-year smoking history. She has never used smokeless tobacco. She reports current alcohol use of about 3.0 standard drinks of alcohol per week. She reports that she does not use drugs.  Medications  Current Facility-Administered Medications:  .  0.9 %  sodium chloride infusion, , Intravenous, Continuous, [name], DO, Last Rate: 50 mL/hr at 03/17/19 0700 .  amLODipine (NORVASC) tablet 10 mg, 10 mg, Per Tube, Daily, Olalere, Adewale A, MD, 10 mg at 03/17/19 0908 .  buPROPion [facility]) tablet 100 mg, 100 mg, Per Tube, TID, Olalere, Adewale A, MD, 100 mg at 03/17/19 0910 .   cefTRIAXone (ROCEPHIN) 1 g in sodium chloride 0.9 % 100 mL IVPB, 1 g, Intravenous, Q24H, Olalere, Adewale A, MD, Last Rate: 200 mL/hr at 03/17/19 0950, 1 g at 03/17/19 0950 .  chlorhexidine gluconate (MEDLINE KIT) (PERIDEX) 0.12 % solution 15 mL, 15 mL, Mouth Rinse, BID, Marshall, Jessica, DO, 15 mL at 03/17/19 0800 .  Chlorhexidine Gluconate Cloth 2 % PADS 6 each, 6 each, Topical, Daily, [name], DO, 6 each at 03/16/19 0902 .  dexmedetomidine (PRECEDEX) 200 MCG/50ML (4 mcg/mL) infusion, 0.4-1.2 mcg/kg/hr, Intravenous, Titrated, Olalere, Adewale A, MD, Last Rate: 10 mL/hr at 03/17/19 0700, 0.4 mcg/kg/hr at 03/17/19 0700 .  famotidine (PEPCID) tablet 20 mg, 20 mg, Per Tube, BID, Nyoka Cowden, Terri L, RPH, 20 mg at 03/17/19 0908 .  feeding supplement (PRO-STAT SUGAR FREE 64) liquid 30 mL, 30 mL, Per Tube, BID, [name], DO, 30 mL at 03/17/19 0927 .  feeding supplement (VITAL HIGH PROTEIN) liquid 1,000 mL, 1,000 mL, Per Tube, Q24H, Ruthann Cancer, Jessica, DO, 1,000 mL at 03/17/19 0945 .  fentaNYL (SUBLIMAZE) injection 25 mcg, 25 mcg, Intravenous, Q2H PRN, [name], DO, 25 mcg at 03/15/19 1040 .  FLUoxetine (PROZAC) 20 MG/5ML solution 20 mg, 20 mg, Per Tube, Daily, Olalere, Adewale A, MD, 20 mg at 03/17/19 0909 .  heparin injection 5,000 Units, 5,000 Units, Subcutaneous, Q8H, [name], DO, 5,000 Units at 03/17/19 0511 .  hydrALAZINE (APRESOLINE) injection 10 mg, 10 mg, Intravenous, Q4H PRN,  Anders Simmonds, MD, 10 mg at 03/17/19 0900 .  ipratropium-albuterol (DUONEB) 0.5-2.5 (3) MG/3ML nebulizer solution 3 mL, 3 mL, Nebulization, Q6H, [name], DO, 3 mL at 03/17/19 0802 .  loratadine (CLARITIN) tablet 10 mg, 10 mg, Per Tube, Daily, Minda Ditto, RPH, 10 mg at 03/17/19 0909 .  LORazepam (ATIVAN) injection 1-2 mg, 1-2 mg, Intravenous, Q2H PRN, Frederik Pear, MD, 2 mg at 03/14/19 1554 .  MEDLINE mouth rinse, 15 mL, Mouth Rinse, 10 times per day, [name],  DO, 15 mL at 03/17/19 0939 .  multivitamin liquid 15 mL, 15 mL, Per Tube, Daily, Ruthann Cancer, Jessica, DO, 15 mL at 03/17/19 0910 .  nicardipine (CARDENE) 89m in 0.86% saline 2063mIV infusion (0.1 mg/ml), 0-15 mg/hr, Intravenous, Continuous, Ogan, OkKerry KassMD, Stopped at 03/17/19 0143 .  ondansetron (ZOFRAN) injection 4 mg, 4 mg, Intravenous, Q6H PRN, MaAudria NineDO .  oxyCODONE (Oxy IR/ROXICODONE) immediate release tablet 10 mg, 10 mg, Per Tube, QID, Olalere, Adewale A, MD, 10 mg at 03/17/19 0909 .  oxyCODONE-acetaminophen (PERCOCET/ROXICET) 5-325 MG per tablet 1 tablet, 1 tablet, Per Tube, Q6H PRN, 1 tablet at 03/17/19 0415 **AND** oxyCODONE (Oxy IR/ROXICODONE) immediate release tablet 5 mg, 5 mg, Per Tube, Q6H PRN, GrNyoka CowdenTerri L, RPH, 5 mg at 03/17/19 0414 .  pregabalin (LYRICA) capsule 75 mg, 75 mg, Per Tube, BID, GrMinda DittoRPH, 75 mg at 03/17/19 0908 .  sodium chloride flush (NS) 0.9 % injection 10-40 mL, 10-40 mL, Intracatheter, Q12H, MaAudria NineDO, 10 mL at 03/17/19 0911 .  sodium chloride flush (NS) 0.9 % injection 10-40 mL, 10-40 mL, Intracatheter, PRN, MaAudria NineDO .  [START ON 03/18/2019] thiamine (VITAMIN B-1) tablet 100 mg, 100 mg, Per Tube, Daily, BeEudelia BunchRPH   Exam: Current vital signs: BP (!) 197/65   Pulse (!) 51   Temp (!) 96.5 F (35.8 C) (Axillary)   Resp 10   Ht '5\' 4"'  (1.626 m)   Wt 100.4 kg   SpO2 97%   BMI 37.99 kg/m  Vital signs in last 24 hours: Temp:  [96.5 F (35.8 C)-98.8 F (37.1 C)] 96.5 F (35.8 C) (08/24 0800) Pulse Rate:  [50-88] 51 (08/24 0700) Resp:  [10-36] 10 (08/24 0700) BP: (158-197)/(57-65) 197/65 (08/24 0900) SpO2:  [93 %-100 %] 97 % (08/24 0804) Arterial Line BP: (109-186)/(42-67) 132/47 (08/24 0700) FiO2 (%):  [50 %-60 %] 50 % (08/24 0804) Weight:  [100.4 kg] 100.4 kg (08/24 0443)  Physical Exam  Constitutional: Appears well-developed and well-nourished.  Psych: Affect appropriate to  situation Eyes: No scleral injection HENT: No OP obstrucion Head: Normocephalic.  Cardiovascular: Normal rate and regular rhythm.  Respiratory: Effort normal, non-labored breathing GI: Soft.  No distension. There is no tenderness.  Skin: WDI  Neuro: Mental Status: Intubated and on Precedex.  When Precedex was turned off patient would open eyes to voice follow some minimal command such as raising her leg and attempting to raise her arm.  She also became very agitated it appeared that she was moving her left arm greater than her right arm. Cranial Nerves: II: Blink to threat bilaterally when off Precedex III,IV, VI: Positive doll's, with pupils 2 mm and reactive bilaterally VII: Intubated  VIII: hearing is intact to voice Motor: Moving all extremities antigravity Sensory: Withdraws to pain all extremities Deep Tendon Reflexes: 2+ and symmetric in the biceps and patellae.  Plantars: Mute bilaterally Cerebellar: Unable to assess  Labs I have reviewed labs  in epic and the results pertinent to this consultation are:  CBC    Component Value Date/Time   WBC 13.2 (H) 03/17/2019 0421   RBC 3.57 (L) 03/17/2019 0421   HGB 11.1 (L) 03/17/2019 0421   HCT 36.8 03/17/2019 0421   PLT 117 (L) 03/17/2019 0421   MCV 103.1 (H) 03/17/2019 0421   MCH 31.1 03/17/2019 0421   MCHC 30.2 03/17/2019 0421   RDW 15.9 (H) 03/17/2019 0421   LYMPHSABS 0.6 (L) 03/16/2019 0923   MONOABS 1.0 03/16/2019 0923   EOSABS 0.0 03/16/2019 0923   BASOSABS 0.0 03/16/2019 0923    CMP     Component Value Date/Time   NA 139 03/17/2019 0421   K 4.3 03/17/2019 0421   CL 108 03/17/2019 0421   CO2 21 (L) 03/17/2019 0421   GLUCOSE 158 (H) 03/17/2019 0421   BUN 63 (H) 03/17/2019 0421   CREATININE 1.24 (H) 03/17/2019 0421   CALCIUM 7.8 (L) 03/17/2019 0421   PROT 6.2 (L) 03/17/2019 0421   ALBUMIN 2.5 (L) 03/17/2019 0421   AST 36 03/17/2019 0421   ALT 46 (H) 03/17/2019 0421   ALKPHOS 61 03/17/2019 0421    BILITOT 1.0 03/17/2019 0421   GFRNONAA 46 (L) 03/17/2019 0421   GFRAA 53 (L) 03/17/2019 0421    Lipid Panel     Component Value Date/Time   TRIG 56 03/15/2019 0803     Imaging I have reviewed the images obtained:  CT-scan of the brain  MRI examination of the brain  Etta Quill PA-C [facility] [phone] 03/17/2019, 10:29 AM     Assessment: 63 year old female with 2-week history of confusion and multiple falls.  Patient found to have fever and elevated white blood cell count secondary to strep viridans in the urine.  Also probable pneumonia.  Blood culture still pending.  Patient does have a history of drinking and the amount of alcohol per day still unknown.  She is also on a lot of neurological drugs and narcotics.  Unclear if patient had cut down significantly.  No acute findings with MRI of brain, no evidence of PRES.  No evidence of stroke.   -- At this time etiology is unclear.  Differential includes continued withdrawal from either narcotics versus alcohol as well as cumulative effects of multiple sedating medications at home. .   -- Thiamine deficiency also on DDx -- EEG recorded during sleep state only shows no seizure or seizure predisposition in the record.   Recommendations: -- Switch precedex to 1 mg/hr Versed drip for possible EtOH withdrawal, as well as for management of agitation. After 24 hours, taper Versed followed by switch to IV Ativan 2 mg q6h x 4 doses, then 1 mg q6h x 8 doses, then PRN -- Otherwise limit sedating medications, medications with anticholinergic effects as well as serotonergic medications, including Wellbutrin, pregabalin, fentanyl, Prozac, oxycodone and Claritin -- Start high-dose IV thiamine 500 mg TID x 3 days then switch to 100 mg po qd -- Frequent neuro checks  Electronically signed: Dr. Kerney Elbe

## 2019-03-17 NOTE — Progress Notes (Signed)
Portable EEG completed at [facility]. Results pending.

## 2019-03-17 NOTE — Progress Notes (Signed)
NAME:  Stephanie SkeenSusan M Chen, MRN:  [id number], DOB:  1956/07/11, LOS: 6 ADMISSION DATE:  03/10/2019, CONSULTATION DATE:  03/20/2019 REFERRING MD:  [facility], CHIEF COMPLAINT:  ETOH withdrawal  Brief History   63 yo female with acute etoh withdrawal and possible sepsis  63 yo with pmh copd, and etoh abuse who prsented with ams to osh. Per limited records sent (pt unresponsive so all history is obtained from chart review). Pt has been confused for 2 weeks. Reportedly was seen by her pcp for the same confusion but it is unclear what occurred after that visit. Daughter had reported pt was having hallucinations, multiple falls and increased weakness. Over the past 7 days she has been utilizing her inhalers more frequently, although husband denies this.   Upon presentation to osh she was tremulous and concern over DT with acute etoh withdraw was raised. She was given multiple doses of ativan/valium and subsequently started on precedex. Husband via the phone states that she "may drink 2-3 beers a day" last drink was yesterday afternoon possibly (he is unsure as he was at work). He is sure she did not drink anything last pm however as she was restless and "so confused" . She has been progressively worsen over the past 24 hours and not sleeping well for the past 2 days. Denies sick contacts, fevers/chills/cough. She has been taking her regularly prescribed medications.   Upon arrival to our ICU pt is poorly responsive, not protecting airway and abdominally breathing. She is febrile but hemodynamically stable, intubated.  Remains encephalopathic   Past Medical History   Past Medical History:  Diagnosis Date  . Arthritis   . Asthma   . COPD (chronic obstructive pulmonary disease) ([facility])   . Fibromyalgia   . Pneumonia   . Shortness of breath dyspnea     Significant Hospital Events   [facility]18[facility] stable 8/23 remains encephalopathic 8/24 still with no purposeful interaction   Consults:  PCCM Neurology  Procedures:    Significant Diagnostic Tests:  8/18 CT head IMPRESSION: No acute intracranial abnormality.  8/21 MRI IMPRESSION: 1. No acute finding including evidence of posterior reversible encephalopathy syndrome. 2. Moderate chronic small vessel ischemia.  Micro Data:  8/18 sars2: neg 8/18: blood cx: Negative 8/18 urine cx: Streptococcus viridans 8/18 resp cx: pending 03/16/2019: Blood culture: Pending  Antimicrobials:  Cefepime 8/18-8/23 vanc 8/18-8/22 Rocephin 8/23>>  Interim history/subjective:  8/18: intubated for airway protection.  8/19: sat performed but pt remains unable to follow commands, agitated but purposeful which is reassuring. Started on propofol to wean off precedex.  8/21: Weaning on pressure support, still on propofol 8/24: Was able to get her to squeeze my hand this morning  Objective   Blood pressure (!) 158/57, pulse (!) 51, temperature (!) 96.5 F (35.8 C), temperature source Axillary, resp. rate 10, height 5\' 4"  (1.626 m), weight 100.4 kg, SpO2 97 %.    Vent Mode: PRVC FiO2 (%):  [50 %-60 %] 50 % Set Rate:  [14 bmp] 14 bmp Vt Set:  [430 mL-500 mL] 500 mL PEEP:  [5 cmH20-10 cmH20] 10 cmH20 Plateau Pressure:  [18 cmH20-24 cmH20] 24 cmH20   Intake/Output Summary (Last 24 hours) at 03/17/2019 0842 Last data filed at 03/17/2019 0700 Gross per 24 hour  Intake 4376.51 ml  Output 1325 ml  Net 3051.51 ml   Filed Weights   03/15/19 0500 03/16/19 0400 03/17/19 0443  Weight: 96 kg 100 kg 100.4 kg   Examination:  Chronically ill-appearing, intermittently was able to squeeze my hand Still writhing around in bed Moist oral mucosa Decreased breath sounds at the bases bilaterally S1-S2 appreciated Abdomen is soft, bowel sounds appreciated Extremities shows no clubbing, Skin mottling   Chest x-ray 8/23 2020-left lower lobe infiltrate, small bilateral pleural effusions-hypoventilated lung fields-reviewed by myself    Resolved Hospital Problem list     Assessment & Plan:   Encephalopathy -Negative CT scan of the head, negative MRI -Labs generally improving -Likely multifactorial -Initial concern was for alcohol withdrawal, but, has been many days in the hospital already -Concern for opiate withdrawal, on high doses of opiates at baseline for chronic back pain, opiates reinitiated at lower doses -Cultures have been negative -Arterial blood gases does reveal some hypercarbia-doubt this is significant enough to cause altered mentation and it was not elevated when she initially came in -Cautious sedation to continue to achieve ventilator synchrony   -Did consult neurology -EEG ordered  Acute respiratory failure -Wean as tolerated -Pressure support as tolerated -Not ready for extubation, limited by mental status  Hypercapnia/hypoxemia -Ventilator changes made -Follow ABG  Chronic obstructive pulmonary disease -Continue bronchodilator support -Probable pneumonia -On cefuroxime  Hypertension -Wean off Cardene -Continue Norvasc -Metoprolol was added yesterday but she has had episodes of bradycardia-stopped  Fever -Strep viridans in urine -Probable pneumonia -No longer having fevers, no significant leukocytosis -Follow blood cultures  Transaminitis with hyperbilirubinemia -Improved  History of EtOH use -Initial concern for alcohol withdrawal however has been many days in the hospital  Chronic pain/fibromyalgia/depression -Patient was on high doses of oxycodone at home -Opiates reinitiated at lower doses -Stimulating antidepressants initiated-was on Cymbalta 60 at home   She remains critically ill Encephalopathic  Consult neurology Order EEG Repeating ammonia level today Labs Third spacing with upper extremity edema-give a dose of 20 mg Lasix  Best practice:  Diet: Vital Pain/Anxiety/Delirium protocol (if indicated): Opiates, Precedex VAP protocol (if indicated): Per  protocol DVT prophylaxis: Heparin GI prophylaxis:  Glucose control: n/a Mobility: bedrest Code Status: Full code  Family Communication: Pending Disposition: ICU  Labs   CBC: Recent Labs  Lab 03/13/19 0214 03/14/19 0440 03/15/19 0417 03/16/19 0923 03/17/19 0421  WBC 9.6 10.5 9.3 15.4* 13.2*  NEUTROABS  --   --   --  13.6*  --   HGB 11.3* 11.3* 10.9* 12.0 11.1*  HCT 35.6* 36.2 35.7* 39.4 36.8  MCV 100.0 98.1 101.4* 103.1* 103.1*  PLT 123* 167 132* 161 117*    Basic Metabolic Panel: Recent Labs  Lab 03/12/19 0221 03/13/19 0214 03/13/19 1038 03/13/19 1700 03/14/19 0440 03/14/19 1714 03/15/19 0417 03/16/19 0446 03/16/19 0923 03/17/19 0421  NA 134* 134*  --   --  133*  --   --   --  136 139  K 3.3* 3.9  --   --  3.5  --   --   --  4.7 4.3  CL 101 101  --   --  101  --   --   --  104 108  CO2 24 25  --   --  24  --   --   --  23 21*  GLUCOSE 166* 122*  --   --  140*  --   --   --  172* 158*  BUN 18 21  --   --  25*  --   --   --  54* 63*  CREATININE 0.85 0.98  --   --  0.94  --  0.94 1.02* 1.12* 1.24*  CALCIUM 7.9* 7.8*  --   --  7.7*  --   --   --  8.0* 7.8*  MG 1.8 1.7 2.4 2.0 2.2 2.2 2.4  --   --   --   PHOS 3.1 2.6 2.8 2.4* 2.3* 3.1 3.4  --   --   --    GFR: Estimated Creatinine Clearance: 52.8 mL/min (A) (by C-G formula based on SCr of 1.24 mg/dL (H)). Recent Labs  Lab 03/24/2019 1829  03/12/19 0940 03/13/19 0214 03/14/19 0440 03/15/19 0417 03/16/19 0923 03/17/19 0421  PROCALCITON <0.10  --  <0.10 1.03 0.66  --   --   --   WBC 5.3   < >  --  9.6 10.5 9.3 15.4* 13.2*   < > = values in this interval not displayed.    Liver Function Tests: Recent Labs  Lab 03/12/19 0221 03/13/19 0214 03/14/19 0440 03/16/19 0923 03/17/19 0421  AST 51* 39 33 30 36  ALT 46* 44 43 44 46*  ALKPHOS 90 75 78 85 61  BILITOT 1.7* 1.6* 1.7* 1.3* 1.0  PROT 6.2* 5.9* 6.8 7.6 6.2*  ALBUMIN 2.6* 2.4* 2.9* 3.2* 2.5*   Recent Labs  Lab 03/05/2019 1829  LIPASE 23  AMYLASE  19*   Recent Labs  Lab 03/12/19 0940  AMMONIA 34    ABG    Component Value Date/Time   PHART 7.327 (L) 03/17/2019 0400   PCO2ART 47.8 03/17/2019 0400   PO2ART 77.9 (L) 03/17/2019 0400   HCO3 24.3 03/17/2019 0400   ACIDBASEDEF 1.3 03/17/2019 0400   O2SAT 94.2 03/17/2019 0400     Coagulation Profile: No results for input(s): INR, PROTIME in the last 168 hours.  Cardiac Enzymes: No results for input(s): CKTOTAL, CKMB, CKMBINDEX, TROPONINI in the last 168 hours.  HbA1C: No results found for: HGBA1C  CBG: Recent Labs  Lab 03/16/19 1553 03/16/19 1940 03/16/19 2335 03/17/19 0404 03/17/19 0743  GLUCAP 124* 120* 116* 124* 93   The patient is critically ill with multiple organ systems failure and requires high complexity decision making for assessment and support, frequent evaluation and titration of therapies, application of advanced monitoring technologies and extensive interpretation of multiple databases. Critical Care Time devoted to patient care services described in this note independent of APP/resident time (if applicable)  is 35 minutes.   Virl DiamondAdewale Idalys Konecny MD [place] [facility] Personal pager: [phone] If unanswered, please page CCM On-call: #[phone]

## 2019-03-17 NOTE — Progress Notes (Signed)
Patient noted to be oozing significantly from injection sites  Check coagulation parameters Decrease heparin to every 12 subcut

## 2019-03-17 NOTE — Procedures (Signed)
History: 63 yo F with confusion  Sedation: Precedex  Technique: This is a 21 channel routine scalp EEG performed at the bedside with bipolar and monopolar montages arranged in accordance to the international 10/20 system of electrode placement. One channel was dedicated to EKG recording.    Background: The background consists of generalized irregular slow activity with intermittent sleep spindles and vertex waves. No epileptiform activity was seen.  Photic stimulation: Physiologic driving is not performed  EEG Abnormalities: 1) Sleep only EEG  Clinical Interpretation: This EEG was recorded in sleep. No seizure or seizure predisposition were seen.  Roland Rack, MD [facility] [phone]  If 7pm- 7am, please page neurology on call as listed in [place].

## 2019-03-18 LAB — CBC WITH DIFFERENTIAL/PLATELET
Abs Immature Granulocytes: 0.24 10*3/uL — ABNORMAL HIGH (ref 0.00–0.07)
Basophils Absolute: 0 10*3/uL (ref 0.0–0.1)
Basophils Relative: 0 %
Eosinophils Absolute: 0.2 10*3/uL (ref 0.0–0.5)
Eosinophils Relative: 2 %
HCT: 36.1 % (ref 36.0–46.0)
Hemoglobin: 10.8 g/dL — ABNORMAL LOW (ref 12.0–15.0)
Immature Granulocytes: 2 %
Lymphocytes Relative: 6 %
Lymphs Abs: 0.9 10*3/uL (ref 0.7–4.0)
MCH: 31.2 pg (ref 26.0–34.0)
MCHC: 29.9 g/dL — ABNORMAL LOW (ref 30.0–36.0)
MCV: 104.3 fL — ABNORMAL HIGH (ref 80.0–100.0)
Monocytes Absolute: 1.5 10*3/uL — ABNORMAL HIGH (ref 0.1–1.0)
Monocytes Relative: 10 %
Neutro Abs: 11.6 10*3/uL — ABNORMAL HIGH (ref 1.7–7.7)
Neutrophils Relative %: 80 %
Platelets: 112 10*3/uL — ABNORMAL LOW (ref 150–400)
RBC: 3.46 MIL/uL — ABNORMAL LOW (ref 3.87–5.11)
RDW: 15.9 % — ABNORMAL HIGH (ref 11.5–15.5)
WBC: 14.5 10*3/uL — ABNORMAL HIGH (ref 4.0–10.5)
nRBC: 0 % (ref 0.0–0.2)

## 2019-03-18 LAB — GLUCOSE, CAPILLARY
Glucose-Capillary: 100 mg/dL — ABNORMAL HIGH (ref 70–99)
Glucose-Capillary: 106 mg/dL — ABNORMAL HIGH (ref 70–99)
Glucose-Capillary: 110 mg/dL — ABNORMAL HIGH (ref 70–99)
Glucose-Capillary: 119 mg/dL — ABNORMAL HIGH (ref 70–99)
Glucose-Capillary: 87 mg/dL (ref 70–99)
Glucose-Capillary: 99 mg/dL (ref 70–99)

## 2019-03-18 LAB — BASIC METABOLIC PANEL
Anion gap: 6 (ref 5–15)
BUN: 66 mg/dL — ABNORMAL HIGH (ref 8–23)
CO2: 24 mmol/L (ref 22–32)
Calcium: 7.7 mg/dL — ABNORMAL LOW (ref 8.9–10.3)
Chloride: 111 mmol/L (ref 98–111)
Creatinine, Ser: 1.26 mg/dL — ABNORMAL HIGH (ref 0.44–1.00)
GFR calc Af Amer: 52 mL/min — ABNORMAL LOW (ref >60)
GFR calc non Af Amer: 45 mL/min — ABNORMAL LOW (ref >60)
Glucose, Bld: 114 mg/dL — ABNORMAL HIGH (ref 70–99)
Potassium: 3.9 mmol/L (ref 3.5–5.1)
Sodium: 141 mmol/L (ref 135–145)

## 2019-03-18 LAB — MAGNESIUM: Magnesium: 2.4 mg/dL (ref 1.7–2.4)

## 2019-03-18 MED ORDER — ACETAMINOPHEN 325 MG PO TABS
650.0000 mg | ORAL_TABLET | Freq: Four times a day (QID) | ORAL | Status: DC | PRN
  Administered 2019-03-18 – 2019-03-25 (×6): 650 mg
  Filled 2019-03-18 (×6): qty 2

## 2019-03-18 MED ORDER — MIDAZOLAM 50MG/50ML (1MG/ML) PREMIX INFUSION
0.5000 mg/h | INTRAVENOUS | Status: DC
  Administered 2019-03-18: 09:00:00 3.5 mg/h via INTRAVENOUS
  Administered 2019-03-19: 4 mg/h via INTRAVENOUS
  Filled 2019-03-18 (×3): qty 50

## 2019-03-18 MED ORDER — FUROSEMIDE 10 MG/ML IJ SOLN
20.0000 mg | Freq: Once | INTRAMUSCULAR | Status: AC
  Administered 2019-03-18: 21:00:00 20 mg via INTRAVENOUS
  Filled 2019-03-18: qty 2

## 2019-03-18 NOTE — Progress Notes (Signed)
eLink Physician-Brief Progress Note Patient Name: Stephanie Chen DOB: May 23, 1956 MRN: [id number]   Date of Service  03/18/2019  HPI/Events of Note  Pt with acute on chronic respiratory failure on the ventilator, and ETOH delirium, concern is for volume overload-she is + 3 liters fluid positive and admission weight is up to 104 kg, pt is being optimized for extubation.  eICU Interventions  Lasix 20 mg iv x 1        [place] U Ogan 03/18/2019, 8:34 PM

## 2019-03-18 NOTE — Progress Notes (Signed)
NAME:  CORTASIA SCREWS, MRN:  [id number], DOB:  05-06-1956, LOS: 7 ADMISSION DATE:  03/21/2019, CONSULTATION DATE:  03/08/2019 REFERRING MD:  OSH, CHIEF COMPLAINT:  ETOH withdrawal  Brief History   63 yo female with acute etoh withdrawal and possible sepsis  63 yo with pmh copd, and etoh abuse who prsented with ams to osh. Per limited records sent (pt unresponsive so all history is obtained from chart review). Pt has been confused for 2 weeks. Reportedly was seen by her pcp for the same confusion but it is unclear what occurred after that visit. Daughter had reported pt was having hallucinations, multiple falls and increased weakness. Over the past 7 days she has been utilizing her inhalers more frequently, although husband denies this.   Upon presentation to osh she was tremulous and concern over DT with acute etoh withdraw was raised. She was given multiple doses of ativan/valium and subsequently started on precedex. Husband via the phone states that she "may drink 2-3 beers a day" last drink was yesterday afternoon possibly (he is unsure as he was at work). He is sure she did not drink anything last pm however as she was restless and "so confused" . She has been progressively worsen over the past 24 hours and not sleeping well for the past 2 days. Denies sick contacts, fevers/chills/cough. She has been taking her regularly prescribed medications.   Upon arrival to our ICU pt is poorly responsive, not protecting airway and abdominally breathing. She is febrile but hemodynamically stable, intubated.     Past Medical History   Past Medical History:  Diagnosis Date  . Arthritis   . Asthma   . COPD (chronic obstructive pulmonary disease) ([place])   . Fibromyalgia   . Pneumonia   . Shortness of breath dyspnea     Significant Hospital Events   8/18: transferred from OSH to [facility] On vent, stable 8/23 remains encephalopathic 8/24 still with no purposeful interaction  Consults:  PCCM Neurology   Procedures:    Significant Diagnostic Tests:  8/18 CT head IMPRESSION: No acute intracranial abnormality.  8/21 MRI IMPRESSION: 1. No acute finding including evidence of posterior reversible encephalopathy syndrome. 2. Moderate chronic small vessel ischemia.  Micro Data:  8/18 sars2: neg 8/18: blood cx: Negative 8/18 urine cx: Streptococcus viridans 8/18 resp cx: Normal flora 03/16/2019: Blood culture: Pending  Antimicrobials:  Cefepime 8/18-8/23 vanc 8/18-8/22 Rocephin 8/23>>  Interim history/subjective:  8/18: intubated for airway protection.  8/19: sat performed but pt remains unable to follow commands, agitated but purposeful which is reassuring. Started on propofol to wean off precedex.  8/21: Weaning on pressure support, still on propofol 8/24: Was able to get her to squeeze my hand this morning  Objective   Blood pressure 119/63, pulse (!) 103, temperature 99.4 F (37.4 C), temperature source Oral, resp. rate 16, height 5\' 4"  (1.626 m), weight 104.7 kg, SpO2 93 %.    Vent Mode: PRVC FiO2 (%):  [30 %-50 %] 50 % Set Rate:  [14 bmp] 14 bmp Vt Set:  [500 mL] 500 mL PEEP:  [10 cmH20] 10 cmH20 Plateau Pressure:  [16 cmH20-22 cmH20] 17 cmH20   Intake/Output Summary (Last 24 hours) at 03/18/2019 0839 Last data filed at 03/18/2019 0600 Gross per 24 hour  Intake 1666.52 ml  Output 2340 ml  Net -673.48 ml   Filed Weights   03/16/19 0400 03/17/19 0443 03/18/19 0500  Weight: 100 kg 100.4 kg 104.7 kg   Examination:  General: Obese ill-appearing  female currently on Versed drip with periods of agitation HEENT: Tracheal tube is in place no JVD or lymphadenopathy is appreciated Neuro: Sedated CV: Sinus rhythm sinus tach with a rate of 120 noted PULM: Decreased breath sounds in the bases GI: soft, bsx4 active  Extremities: warm/dry, 1+ edema  Skin: no rashes or lesions    03/18/2019 no chest x-ray   Resolved Hospital Problem list     Assessment & Plan:    Encephalopathy Plan: Currently on sedation for alcohol withdrawal Neurology is following Transition from Versed to Ativan near future Agitation is an issue     Acute respiratory failure Plan: Wean per protocol Encephalopathic state impedes any weaning at this time  Hypercapnia/hypoxemia Plan: Monitor ABG Ventilator changes as needed  Chronic obstructive pulmonary disease Plan: Questionable pneumonia continue antibiotics Bronchodilators  Hypertension Plan:\ Wean Cardene Continue Norvasc presently PRN Lasix  Fever Strep viridans in urine Probable pneumonia Plan: Follow culture data Currently on Rocephin day 2  Transaminitis with hyperbilirubinemia Continue to monitor  History of EtOH use Currently on Versed drip for multiple days without alcohol  Chronic pain/fibromyalgia/depression Patient was on high doses of oxycodone at home Opiates reinitiated at lower doses Stimulating antidepressants initiated-was on Cymbalta 60 at home Plan  Currently on Versed drip per neurology's recommendation Appreciate neurology's input EEG is noted but no significant variations Continue to monitor ammonia level    Best practice:  Diet: Vital Pain/Anxiety/Delirium protocol (if indicated): Opiates, currently on Versed drip VAP protocol (if indicated): Per protocol DVT prophylaxis: Heparin GI prophylaxis:  Glucose control: n/a Mobility: bedrest Code Status: Full code  Family Communication: 03/18/2019 no family at bedside Disposition: ICU  Labs   CBC: Recent Labs  Lab 03/14/19 0440 03/15/19 0417 03/16/19 0923 03/17/19 0421 03/18/19 0148  WBC 10.5 9.3 15.4* 13.2* 14.5*  NEUTROABS  --   --  13.6*  --  11.6*  HGB 11.3* 10.9* 12.0 11.1* 10.8*  HCT 36.2 35.7* 39.4 36.8 36.1  MCV 98.1 101.4* 103.1* 103.1* 104.3*  PLT 167 132* 161 117* 112*    Basic Metabolic Panel: Recent Labs  Lab 03/13/19 0214 03/13/19 1038 03/13/19 1700 03/14/19 0440 03/14/19 1714  03/15/19 0417 03/16/19 0446 03/16/19 0923 03/17/19 0421 03/18/19 0148 03/18/19 0230  NA 134*  --   --  133*  --   --   --  136 139 141  --   K 3.9  --   --  3.5  --   --   --  4.7 4.3 3.9  --   CL 101  --   --  101  --   --   --  104 108 111  --   CO2 25  --   --  24  --   --   --  23 21* 24  --   GLUCOSE 122*  --   --  140*  --   --   --  172* 158* 114*  --   BUN 21  --   --  25*  --   --   --  54* 63* 66*  --   CREATININE 0.98  --   --  0.94  --  0.94 1.02* 1.12* 1.24* 1.26*  --   CALCIUM 7.8*  --   --  7.7*  --   --   --  8.0* 7.8* 7.7*  --   MG 1.7 2.4 2.0 2.2 2.2 2.4  --   --   --   --  2.4  PHOS 2.6 2.8 2.4* 2.3* 3.1 3.4  --   --   --   --   --    GFR: Estimated Creatinine Clearance: 53.2 mL/min (A) (by C-G formula based on SCr of 1.26 mg/dL (H)). Recent Labs  Lab 07-13-19 1829  03/12/19 0940 03/13/19 0214 03/14/19 0440 03/15/19 0417 03/16/19 0923 03/17/19 0421 03/18/19 0148  PROCALCITON <0.10  --  <0.10 1.03 0.66  --   --   --   --   WBC 5.3   < >  --  9.6 10.5 9.3 15.4* 13.2* 14.5*   < > = values in this interval not displayed.    Liver Function Tests: Recent Labs  Lab 03/12/19 0221 03/13/19 0214 03/14/19 0440 03/16/19 0923 03/17/19 0421  AST 51* 39 33 30 36  ALT 46* 44 43 44 46*  ALKPHOS 90 75 78 85 61  BILITOT 1.7* 1.6* 1.7* 1.3* 1.0  PROT 6.2* 5.9* 6.8 7.6 6.2*  ALBUMIN 2.6* 2.4* 2.9* 3.2* 2.5*   Recent Labs  Lab 07-13-19 1829  LIPASE 23  AMYLASE 19*   Recent Labs  Lab 03/12/19 0940 03/17/19 0926  AMMONIA 34 42*    ABG    Component Value Date/Time   PHART 7.327 (L) 03/17/2019 0400   PCO2ART 47.8 03/17/2019 0400   PO2ART 77.9 (L) 03/17/2019 0400   HCO3 24.3 03/17/2019 0400   ACIDBASEDEF 1.3 03/17/2019 0400   O2SAT 94.2 03/17/2019 0400     Coagulation Profile: Recent Labs  Lab 03/17/19 1103  INR 1.1    Cardiac Enzymes: No results for input(s): CKTOTAL, CKMB, CKMBINDEX, TROPONINI in the last 168 hours.  HbA1C: No results  found for: HGBA1C  CBG: Recent Labs  Lab 03/17/19 1614 03/17/19 2014 03/17/19 2311 03/18/19 0356 03/18/19 0801  GLUCAP 102* 101* 89 87 110*   App cct 34 min  Steve Deborha Moseley ACNP [facility] Pager [phone] till 1 pm If no answer page [phone] 03/18/2019, 8:40 AM '

## 2019-03-18 NOTE — Procedures (Signed)
Arterial Catheter Insertion Procedure Note Stephanie Chen [id number] 11/20/1955  Procedure: Insertion of Arterial Catheter  Indications: Blood pressure monitoring  Procedure Details Consent: Risks of procedure as well as the alternatives and risks of each were explained to the (patient/caregiver).  Consent for procedure obtained. and Unable to obtain consent because of altered level of consciousness. Time Out: Verified patient identification, verified procedure, site/side was marked, verified correct patient position, special equipment/implants available, medications/allergies/relevent history reviewed, required imaging and test results available.  Performed  Maximum sterile technique was used including antiseptics, cap, gloves, gown, hand hygiene, mask and sheet. Skin prep: Chlorhexidine; local anesthetic administered 20 gauge catheter was inserted into left radial artery using the Seldinger technique. ULTRASOUND GUIDANCE USED: NO Evaluation Blood flow good; BP tracing good. Complications: No apparent complications.   Stephanie Chen 03/18/2019

## 2019-03-18 NOTE — Progress Notes (Addendum)
eLink Physician-Brief Progress Note Patient Name: Stephanie Chen DOB: July 31, 1955 MRN: [id number]   Date of Service  03/18/2019  HPI/Events of Note  Agitation - Patient squirming all over bed.   eICU Interventions  Will order: 1. Change versed order to titrated low dose Versed IV infusion. Titrate to RASS = 0.  2. Replace A-line.     Intervention Category Major Interventions: Delirium, psychosis, severe agitation - evaluation and management  Sommer,Steven Eugene 03/18/2019, 1:03 AM

## 2019-03-19 ENCOUNTER — Inpatient Hospital Stay (HOSPITAL_COMMUNITY): Payer: Commercial Managed Care - PPO

## 2019-03-19 DIAGNOSIS — J181 Lobar pneumonia, unspecified organism: Secondary | ICD-10-CM

## 2019-03-19 DIAGNOSIS — N179 Acute kidney failure, unspecified: Secondary | ICD-10-CM

## 2019-03-19 LAB — BASIC METABOLIC PANEL
Anion gap: 8 (ref 5–15)
BUN: 74 mg/dL — ABNORMAL HIGH (ref 8–23)
CO2: 23 mmol/L (ref 22–32)
Calcium: 7.6 mg/dL — ABNORMAL LOW (ref 8.9–10.3)
Chloride: 112 mmol/L — ABNORMAL HIGH (ref 98–111)
Creatinine, Ser: 1.68 mg/dL — ABNORMAL HIGH (ref 0.44–1.00)
GFR calc Af Amer: 37 mL/min — ABNORMAL LOW (ref >60)
GFR calc non Af Amer: 32 mL/min — ABNORMAL LOW (ref >60)
Glucose, Bld: 131 mg/dL — ABNORMAL HIGH (ref 70–99)
Potassium: 4.2 mmol/L (ref 3.5–5.1)
Sodium: 143 mmol/L (ref 135–145)

## 2019-03-19 LAB — BLOOD GAS, ARTERIAL
Acid-base deficit: 1.2 mmol/L (ref 0.0–2.0)
Bicarbonate: 23.5 mmol/L (ref 20.0–28.0)
Drawn by: 270211
FIO2: 0.4
MECHVT: 500 mL
O2 Saturation: 90.9 %
PEEP: 10 cmH2O
Patient temperature: 37
RATE: 14 resp/min
pCO2 arterial: 41.6 mmHg (ref 32.0–48.0)
pH, Arterial: 7.37 (ref 7.350–7.450)
pO2, Arterial: 60.6 mmHg — ABNORMAL LOW (ref 83.0–108.0)

## 2019-03-19 LAB — CBC WITH DIFFERENTIAL/PLATELET
Abs Immature Granulocytes: 0.27 10*3/uL — ABNORMAL HIGH (ref 0.00–0.07)
Basophils Absolute: 0 10*3/uL (ref 0.0–0.1)
Basophils Relative: 0 %
Eosinophils Absolute: 0 10*3/uL (ref 0.0–0.5)
Eosinophils Relative: 0 %
HCT: 36.2 % (ref 36.0–46.0)
Hemoglobin: 10.7 g/dL — ABNORMAL LOW (ref 12.0–15.0)
Immature Granulocytes: 2 %
Lymphocytes Relative: 5 %
Lymphs Abs: 0.8 10*3/uL (ref 0.7–4.0)
MCH: 31.3 pg (ref 26.0–34.0)
MCHC: 29.6 g/dL — ABNORMAL LOW (ref 30.0–36.0)
MCV: 105.8 fL — ABNORMAL HIGH (ref 80.0–100.0)
Monocytes Absolute: 1.8 10*3/uL — ABNORMAL HIGH (ref 0.1–1.0)
Monocytes Relative: 12 %
Neutro Abs: 12.4 10*3/uL — ABNORMAL HIGH (ref 1.7–7.7)
Neutrophils Relative %: 81 %
Platelets: 124 10*3/uL — ABNORMAL LOW (ref 150–400)
RBC: 3.42 MIL/uL — ABNORMAL LOW (ref 3.87–5.11)
RDW: 16 % — ABNORMAL HIGH (ref 11.5–15.5)
WBC: 15.3 10*3/uL — ABNORMAL HIGH (ref 4.0–10.5)
nRBC: 0 % (ref 0.0–0.2)

## 2019-03-19 LAB — GLUCOSE, CAPILLARY
Glucose-Capillary: 121 mg/dL — ABNORMAL HIGH (ref 70–99)
Glucose-Capillary: 131 mg/dL — ABNORMAL HIGH (ref 70–99)
Glucose-Capillary: 145 mg/dL — ABNORMAL HIGH (ref 70–99)
Glucose-Capillary: 136 mg/dL — ABNORMAL HIGH (ref 70–99)
Glucose-Capillary: 133 mg/dL — ABNORMAL HIGH (ref 70–99)

## 2019-03-19 LAB — MAGNESIUM: Magnesium: 2.5 mg/dL — ABNORMAL HIGH (ref 1.7–2.4)

## 2019-03-19 LAB — AMMONIA: Ammonia: 50 umol/L — ABNORMAL HIGH (ref 9–35)

## 2019-03-19 LAB — PHOSPHORUS: Phosphorus: 3.4 mg/dL (ref 2.5–4.6)

## 2019-03-19 MED ORDER — VANCOMYCIN HCL 10 G IV SOLR
1500.0000 mg | INTRAVENOUS | Status: DC
  Filled 2019-03-19: qty 1500

## 2019-03-19 MED ORDER — CHLORHEXIDINE GLUCONATE CLOTH 2 % EX PADS
6.0000 | MEDICATED_PAD | Freq: Every day | CUTANEOUS | Status: DC
  Administered 2019-03-19 – 2019-03-26 (×6): 6 via TOPICAL

## 2019-03-19 MED ORDER — VANCOMYCIN HCL 10 G IV SOLR
2000.0000 mg | INTRAVENOUS | Status: AC
  Administered 2019-03-19: 2000 mg via INTRAVENOUS
  Filled 2019-03-19: qty 2000

## 2019-03-19 MED ORDER — SODIUM CHLORIDE 0.9 % IV SOLN
1.0000 g | Freq: Once | INTRAVENOUS | Status: AC
  Administered 2019-03-19: 1 g via INTRAVENOUS
  Filled 2019-03-19: qty 1

## 2019-03-19 MED ORDER — LORAZEPAM 2 MG/ML IJ SOLN
2.0000 mg | Freq: Four times a day (QID) | INTRAMUSCULAR | Status: DC
  Filled 2019-03-19: qty 1

## 2019-03-19 MED ORDER — METOPROLOL TARTRATE 12.5 MG HALF TABLET
12.5000 mg | ORAL_TABLET | Freq: Two times a day (BID) | ORAL | Status: DC
  Administered 2019-03-19 – 2019-03-21 (×5): 12.5 mg via ORAL
  Filled 2019-03-19 (×6): qty 1

## 2019-03-19 MED ORDER — SODIUM CHLORIDE 0.9 % IV SOLN
1.0000 g | Freq: Two times a day (BID) | INTRAVENOUS | Status: DC
  Administered 2019-03-20 – 2019-03-23 (×7): 1 g via INTRAVENOUS
  Filled 2019-03-19 (×7): qty 1

## 2019-03-19 MED ORDER — LORAZEPAM 2 MG/ML IJ SOLN
1.0000 mg | Freq: Four times a day (QID) | INTRAMUSCULAR | Status: DC
  Administered 2019-03-21: 1 mg via INTRAVENOUS
  Filled 2019-03-19: qty 1

## 2019-03-19 MED ORDER — FUROSEMIDE 10 MG/ML IJ SOLN
20.0000 mg | Freq: Once | INTRAMUSCULAR | Status: AC
  Administered 2019-03-19: 10:00:00 20 mg via INTRAVENOUS
  Filled 2019-03-19: qty 2

## 2019-03-19 NOTE — Progress Notes (Addendum)
Talked to Dr. Ander Slade and no need for Neuro to follow up at this point. PCCM will call back if needed.   Neurology will S/O.  Etta Quill PA-C [facility] [phone]  M-F  (9:00 am- 5:00 PM)  03/19/2019, 9:57 AM

## 2019-03-19 NOTE — Progress Notes (Addendum)
NAME:  Stephanie Chen, MRN:  [id number], DOB:  1956-05-18, LOS: 8 ADMISSION DATE:  03/09/2019, CONSULTATION DATE:  03/05/2019 REFERRING MD:  OSH, CHIEF COMPLAINT:  ETOH withdrawal  Brief History   63 yo female with acute etoh withdrawal and possible sepsis  63 yo with pmh copd, and etoh abuse who prsented with ams to osh. Per limited records sent (pt unresponsive so all history is obtained from chart review). Pt has been confused for 2 weeks. Reportedly was seen by her pcp for the same confusion but it is unclear what occurred after that visit. Daughter had reported pt was having hallucinations, multiple falls and increased weakness. Over the past 7 days she has been utilizing her inhalers more frequently, although husband denies this.   Upon presentation to osh she was tremulous and concern over DT with acute etoh withdraw was raised. She was given multiple doses of ativan/valium and subsequently started on precedex. Husband via the phone states that she "may drink 2-3 beers a day" last drink was yesterday afternoon possibly (he is unsure as he was at work). He is sure she did not drink anything last pm however as she was restless and "so confused" . She has been progressively worsen over the past 24 hours and not sleeping well for the past 2 days. Denies sick contacts, fevers/chills/cough. She has been taking her regularly prescribed medications.   Upon arrival to our ICU pt is poorly responsive, not protecting airway and abdominally breathing. She is febrile but hemodynamically stable, intubated.     Past Medical History   Past Medical History:  Diagnosis Date  . Arthritis   . Asthma   . COPD (chronic obstructive pulmonary disease) ([facility])   . Fibromyalgia   . Pneumonia   . Shortness of breath dyspnea     Significant Hospital Events   8/18: [facility], stable 8/23 remains encephalopathic 8/24 still with no purposeful interaction  Consults:  PCCM Neurology   Procedures:    Significant Diagnostic Tests:  8/18 CT head IMPRESSION: No acute intracranial abnormality.  8/21 MRI IMPRESSION: 1. No acute finding including evidence of posterior reversible encephalopathy syndrome. 2. Moderate chronic small vessel ischemia.  Micro Data:  8/18 sars2: neg 8/18: blood cx: Negative 8/18 urine cx: Streptococcus viridans 8/18 resp cx: Normal flora 03/16/2019: Blood culture: Pending  Antimicrobials:  Cefepime 8/18-8/23 vanc 8/18-8/22 Rocephin 8/23>>  Interim history/subjective:  8/18: intubated for airway protection.  8/19: sat performed but pt remains unable to follow commands, agitated but purposeful which is reassuring. Started on propofol to wean off precedex.  8/21: Weaning on pressure support, still on propofol 8/24: Was able to get her to squeeze my hand this morning 03/19/2019 heavily sedated  Objective   Blood pressure (!) 162/91, pulse (!) 105, temperature (!) 101.5 F (38.6 C), temperature source Oral, resp. rate 16, height 5\' 4"  (1.626 m), weight 103.9 kg, SpO2 92 %.    Vent Mode: PRVC FiO2 (%):  [40 %] 40 % Set Rate:  [14 bmp] 14 bmp Vt Set:  [500 mL] 500 mL PEEP:  [10 cmH20] 10 cmH20 Plateau Pressure:  [15 cmH20-26 cmH20] 20 cmH20   Intake/Output Summary (Last 24 hours) at 03/19/2019 0817 Last data filed at 03/19/2019 0600 Gross per 24 hour  Intake 891.84 ml  Output 2400 ml  Net -1508.16 ml   Filed Weights   03/17/19 0443 03/18/19 0500 03/19/19 0500  Weight: 100.4 kg 104.7 kg 103.9 kg   Examination:  General: Disheveled female who is heavily sedated on full mechanical ventilatory support HEENT: Endotracheal tube and gastric tube in place tube feeding of 1 Neuro: Heavily sedated poorly responsive to painful stimuli CV: Heart sounds are regular.  Ectopics noted PULM: Decreased breath sounds in the bases GI: soft, bsx4 active  Extremities: warm/dry, 1+ edema  Skin: no rashes or lesions     03/19/2019 chest x-ray  reviewed shows worsening bilateral airspace disease   Resolved Hospital Problem list     Assessment & Plan:   Encephalopathy Plan: Transition to Ativan Appreciate neurology's involvement Agitation remains an issue     Acute respiratory failure Plan: Wean per protocol Decrease sedation as is a block to extubate Continue pulmonary toilet Bronchodilators Repeat diuresis on 03/19/2019 BUN and creatinine are noted.    Chronic obstructive pulmonary disease Plan: Sputum demonstrates normal flora Continue pulmonary toilet  Hypertension Plan: Continue Norvasc and as needed Lasix  Fever Strep viridans in urine Probable pneumonia Plan: Culture data is now in Currently on Rocephin day 3  Transaminitis with hyperbilirubinemia Continue to monitor  History of EtOH use Transition from Versed to Ativan Continue multivitamin /thiamine  Chronic pain/fibromyalgia/depression Patient was on high doses of oxycodone at home Opiates reinitiated at lower doses Stimulating antidepressants initiated-was on Cymbalta 60 at home Plan  Transition from Versed to Ativan per neurology's recommendation Appreciate neurology's input Continue to monitor ammonia level  Renal insufficiency Lab Results  Component Value Date   CREATININE 1.68 (H) 03/19/2019   CREATININE 1.26 (H) 03/18/2019   CREATININE 1.24 (H) 03/17/2019   Recent Labs  Lab 03/17/19 0421 03/18/19 0148 03/19/19 0500  K 4.3 3.9 4.2   Plan: 03/19/2019 we will repeat Lasix and closely monitor BUN and creatinine.  Note worsening airspace disease on chest x-ray goal was negative I&O   Best practice:  Diet: Vital Pain/Anxiety/Delirium protocol (if indicated): Opiates, currently on Versed drip with plan to change to Ativan and lighten sedation VAP protocol (if indicated): Per protocol DVT prophylaxis: Heparin GI prophylaxis:  Glucose control: n/a Mobility: bedrest Code Status: Full code  Family Communication:  03/19/2019 no family at bedside Disposition: ICU  Labs   CBC: Recent Labs  Lab 03/15/19 0417 03/16/19 0923 03/17/19 0421 03/18/19 0148 03/19/19 0500  WBC 9.3 15.4* 13.2* 14.5* 15.3*  NEUTROABS  --  13.6*  --  11.6* 12.4*  HGB 10.9* 12.0 11.1* 10.8* 10.7*  HCT 35.7* 39.4 36.8 36.1 36.2  MCV 101.4* 103.1* 103.1* 104.3* 105.8*  PLT 132* 161 117* 112* 124*    Basic Metabolic Panel: Recent Labs  Lab 03/13/19 1700 03/14/19 0440 03/14/19 1714 03/15/19 0417 03/16/19 0446 03/16/19 0923 03/17/19 0421 03/18/19 0148 03/18/19 0230 03/19/19 0500  NA  --  133*  --   --   --  136 139 141  --  143  K  --  3.5  --   --   --  4.7 4.3 3.9  --  4.2  CL  --  101  --   --   --  104 108 111  --  112*  CO2  --  24  --   --   --  23 21* 24  --  23  GLUCOSE  --  140*  --   --   --  172* 158* 114*  --  131*  BUN  --  25*  --   --   --  54* 63* 66*  --  74*  CREATININE  --  0.94  --  0.94 1.02* 1.12* 1.24* 1.26*  --  1.68*  CALCIUM  --  7.7*  --   --   --  8.0* 7.8* 7.7*  --  7.6*  MG 2.0 2.2 2.2 2.4  --   --   --   --  2.4 2.5*  PHOS 2.4* 2.3* 3.1 3.4  --   --   --   --   --  3.4   GFR: Estimated Creatinine Clearance: 39.7 mL/min (A) (by C-G formula based on SCr of 1.68 mg/dL (H)). Recent Labs  Lab 03/12/19 0940  03/13/19 0214 03/14/19 0440  03/16/19 0923 03/17/19 0421 03/18/19 0148 03/19/19 0500  PROCALCITON <0.10  --  1.03 0.66  --   --   --   --   --   WBC  --    < > 9.6 10.5   < > 15.4* 13.2* 14.5* 15.3*   < > = values in this interval not displayed.    Liver Function Tests: Recent Labs  Lab 03/13/19 0214 03/14/19 0440 03/16/19 0923 03/17/19 0421  AST 39 33 30 36  ALT 44 43 44 46*  ALKPHOS 75 78 85 61  BILITOT 1.6* 1.7* 1.3* 1.0  PROT 5.9* 6.8 7.6 6.2*  ALBUMIN 2.4* 2.9* 3.2* 2.5*   No results for input(s): LIPASE, AMYLASE in the last 168 hours. Recent Labs  Lab 03/12/19 0940 03/17/19 0926  AMMONIA 34 42*    ABG    Component Value Date/Time   PHART 7.327  (L) 03/17/2019 0400   PCO2ART 47.8 03/17/2019 0400   PO2ART 77.9 (L) 03/17/2019 0400   HCO3 24.3 03/17/2019 0400   ACIDBASEDEF 1.3 03/17/2019 0400   O2SAT 94.2 03/17/2019 0400     Coagulation Profile: Recent Labs  Lab 03/17/19 1103  INR 1.1    Cardiac Enzymes: No results for input(s): CKTOTAL, CKMB, CKMBINDEX, TROPONINI in the last 168 hours.  HbA1C: No results found for: HGBA1C  CBG: Recent Labs  Lab 03/18/19 1614 03/18/19 1916 03/18/19 2350 03/19/19 0303 03/19/19 0745  GLUCAP 99 106* 119* 121* 131*   App cct 34 min  Richardson Landry Aariz Maish ACNP [facility] PCCM Pager [phone] till 1 pm If no answer page [phone] 03/19/2019, 8:17 AM '

## 2019-03-19 NOTE — Progress Notes (Signed)
Nutrition Follow-up  DOCUMENTATION CODES:   Obesity unspecified  INTERVENTION:  - continue Vital High Protein @ 40 ml/hr with 30 ml prostat BID. - free water to continue to be per MD/NP.   NUTRITION DIAGNOSIS:   Inadequate oral intake related to inability to eat as evidenced by NPO status. -ongoing  GOAL:   Provide needs based on ASPEN/SCCM guidelines -met with TF regimen  MONITOR:   Vent status, TF tolerance, Labs, Weight trends  ASSESSMENT:   63 year old with medical history of COPD and alcohol abuse. She presented to the ED with AMS (confusion x2 weeks PTA) and OSH. Her daughter reported that the patient was also experiencing hallucinations, increased weakness, and had multiple falls PTA. Over the past 7 days, she may have been using her inhalers more frequently. She was tremulous at the time of presentation and there was concern for DTs with acute alcohol withdrawal. She was given multiple doses of Ativan and was started on Precedex. Husband reported that patient drinks 2-3 beers/day. Upon arrival to the ICU, she was poorly responsive, not protecting her airway, and was abdominally breath. She was also febrile.  Weight continues to trend up. Estimated needs remain appropriate. Patient remains intubated with OGT in place and she is receiving Vital High Protein @ 40 ml/hr with 30 ml prostat BID and 30 ml free water QID. This regimen is providing 1160 kcal, 114 grams protein, and 922 ml free water.   Spoke with RN at bedside, he reports that patient is tolerating TF without any notable issues. He states plan for wake-up assessment later today.   Per notes: - Neurology continues to follow--EEG done 8/24 and showed no signs of seizure - patient off sedation as goal continues to be to move toward extubation - encephalopathy--ongoing agitation - COPD--worsening airspace disease on CXR - fever--rocephin ordered - transaminitis and hyperbilirubinemia - hx of alcohol abuse - chronic  pain/fibromyalgia - depression - renal insufficiency    Patient is currently intubated on ventilator support MV: 9.5 L/min Temp (24hrs), Avg:100.6 F (38.1 C), Min:99.6 F (37.6 C), Max:101.6 F (38.7 C) Propofol: none BP: 164/87 and MAP: 109    Labs reviewed; CBGs: 121 and 131 mg/dl today, Cl: 112 mmol/l, BUN: 74 mg/dl, creatinine: 1.68 mg/dl, Ca: 7.6 mg/dl, Mg: 2.5 mg/dl, GFR: 32 ml/min.  Medications reviewed; 20 mg pepcid per OGT BID, 20 mg IV lasix x1 dose 8/26, 15 ml liquid multivitamin/day, 100 mg thiamine/day.  IVF; NS @ 50 ml/hr. Drip; versed @   Diet Order:   Diet Order            Diet NPO time specified  Diet effective now              EDUCATION NEEDS:   No education needs have been identified at this time  Skin:  Skin Assessment: Reviewed RN Assessment  Last BM:  PTA/unknown  Height:   Ht Readings from Last 1 Encounters:  03/23/2019 '5\' 4"'  (1.626 m)    Weight:   Wt Readings from Last 1 Encounters:  03/19/19 103.9 kg    Ideal Body Weight:  54.5 kg  BMI:  Body mass index is 39.32 kg/m.  Estimated Nutritional Needs:   Kcal:  3818-2993 kcal  Protein:  >/= 109 grams  Fluid:  >/= 1.8 L/day     Jarome Matin, MS, RD, LDN, [facility] Inpatient Clinical Dietitian Pager # [phone] After hours/weekend pager # [phone]

## 2019-03-19 NOTE — Progress Notes (Addendum)
Checked upon patient  Tachycardic Grimaces to chest pressure  We will give dose of metoprolol for hypertension and tachycardia  Fevers despite being on ceftriaxone for 4 days  Escalate antibiotic vancomycin, meropenem--renally adjusted dose Blood cultures have been drawn, urine cultures requested, tracheal aspirate

## 2019-03-19 NOTE — Progress Notes (Signed)
Pharmacy Antibiotic Note  Stephanie Chen is a 63 y.o. female admitted on 02/26/2019 with sepsis.  Pharmacy has been consulted for vancomycin + meropenem dosing.  Patient has been on antibiotics for several days, being escalated to broad spectrum coverage.  Today, 03/19/19  WBC 15.3  SCr 1.7, CrCl 39 mL/min. SCr trending up  Tmax 102.7 F  Plan:  Meropenem 1 g IV q12h  Vancomycin 2000 mg LD then 1500 mg IV q48h  SCr daily  Follow renal function  Follow culture data  Check vancomycin levels at steady state if indicated  Height: 5\' 4"  (162.6 cm) Weight: 229 lb 0.9 oz (103.9 kg) IBW/kg (Calculated) : 54.7  Temp (24hrs), Avg:101 F (38.3 C), Min:99.6 F (37.6 C), Max:102.7 F (39.3 C)  Recent Labs  Lab 03/15/19 0417 03/16/19 0446 03/16/19 0923 03/17/19 0421 03/18/19 0148 03/19/19 0500  WBC 9.3  --  15.4* 13.2* 14.5* 15.3*  CREATININE 0.94 1.02* 1.12* 1.24* 1.26* 1.68*    Estimated Creatinine Clearance: 39.7 mL/min (A) (by C-G formula based on SCr of 1.68 mg/dL (H)).    No Known Allergies  Antimicrobials this admission: vancomycin 8/26 >>  meropenem 8/26 >>  Ceftriaxone 8/23 >> 8/26 Vancomycin 8/18 >> 8/22 Cefepime 8/18 >> 8/23  Dose adjustments this admission:  Microbiology results: 8/18 BCx: NGF 8/23 BCx: no growth 3 days 8/19 UCx: >100K viridans streptococcus 8/18 Respiratory: normal flora 8/18 MRSA: Negative 8/26 tracheal aspirate: Pending  Thank you for allowing pharmacy to be a part of this patient's care.  Lenis Noon, PharmD 03/19/2019 5:39 PM

## 2019-03-20 ENCOUNTER — Inpatient Hospital Stay (HOSPITAL_COMMUNITY): Payer: Commercial Managed Care - PPO

## 2019-03-20 DIAGNOSIS — J9601 Acute respiratory failure with hypoxia: Secondary | ICD-10-CM | POA: Diagnosis present

## 2019-03-20 DIAGNOSIS — G92 Toxic encephalopathy: Secondary | ICD-10-CM

## 2019-03-20 DIAGNOSIS — G928 Other toxic encephalopathy: Secondary | ICD-10-CM | POA: Diagnosis present

## 2019-03-20 DIAGNOSIS — J9602 Acute respiratory failure with hypercapnia: Secondary | ICD-10-CM

## 2019-03-20 DIAGNOSIS — Z978 Presence of other specified devices: Secondary | ICD-10-CM

## 2019-03-20 LAB — GLUCOSE, CAPILLARY
Glucose-Capillary: 117 mg/dL — ABNORMAL HIGH (ref 70–99)
Glucose-Capillary: 118 mg/dL — ABNORMAL HIGH (ref 70–99)
Glucose-Capillary: 122 mg/dL — ABNORMAL HIGH (ref 70–99)
Glucose-Capillary: 124 mg/dL — ABNORMAL HIGH (ref 70–99)
Glucose-Capillary: 130 mg/dL — ABNORMAL HIGH (ref 70–99)
Glucose-Capillary: 140 mg/dL — ABNORMAL HIGH (ref 70–99)
Glucose-Capillary: 146 mg/dL — ABNORMAL HIGH (ref 70–99)

## 2019-03-20 LAB — PHOSPHORUS: Phosphorus: 3.7 mg/dL (ref 2.5–4.6)

## 2019-03-20 LAB — HEPATIC FUNCTION PANEL
ALT: 36 U/L (ref 0–44)
AST: 28 U/L (ref 15–41)
Albumin: 2.5 g/dL — ABNORMAL LOW (ref 3.5–5.0)
Alkaline Phosphatase: 81 U/L (ref 38–126)
Bilirubin, Direct: 0.4 mg/dL — ABNORMAL HIGH (ref 0.0–0.2)
Indirect Bilirubin: 0.3 mg/dL (ref 0.3–0.9)
Total Bilirubin: 0.7 mg/dL (ref 0.3–1.2)
Total Protein: 6.3 g/dL — ABNORMAL LOW (ref 6.5–8.1)

## 2019-03-20 LAB — BASIC METABOLIC PANEL
Anion gap: 11 (ref 5–15)
BUN: 90 mg/dL — ABNORMAL HIGH (ref 8–23)
CO2: 20 mmol/L — ABNORMAL LOW (ref 22–32)
Calcium: 7.3 mg/dL — ABNORMAL LOW (ref 8.9–10.3)
Chloride: 111 mmol/L (ref 98–111)
Creatinine, Ser: 2.25 mg/dL — ABNORMAL HIGH (ref 0.44–1.00)
GFR calc Af Amer: 26 mL/min — ABNORMAL LOW (ref >60)
GFR calc non Af Amer: 22 mL/min — ABNORMAL LOW (ref >60)
Glucose, Bld: 130 mg/dL — ABNORMAL HIGH (ref 70–99)
Potassium: 4.1 mmol/L (ref 3.5–5.1)
Sodium: 142 mmol/L (ref 135–145)

## 2019-03-20 LAB — CBC WITH DIFFERENTIAL/PLATELET
Abs Immature Granulocytes: 0.24 10*3/uL — ABNORMAL HIGH (ref 0.00–0.07)
Basophils Absolute: 0 10*3/uL (ref 0.0–0.1)
Basophils Relative: 0 %
Eosinophils Absolute: 0 10*3/uL (ref 0.0–0.5)
Eosinophils Relative: 0 %
HCT: 34.2 % — ABNORMAL LOW (ref 36.0–46.0)
Hemoglobin: 10 g/dL — ABNORMAL LOW (ref 12.0–15.0)
Immature Granulocytes: 2 %
Lymphocytes Relative: 6 %
Lymphs Abs: 0.9 10*3/uL (ref 0.7–4.0)
MCH: 30.8 pg (ref 26.0–34.0)
MCHC: 29.2 g/dL — ABNORMAL LOW (ref 30.0–36.0)
MCV: 105.2 fL — ABNORMAL HIGH (ref 80.0–100.0)
Monocytes Absolute: 2.2 10*3/uL — ABNORMAL HIGH (ref 0.1–1.0)
Monocytes Relative: 15 %
Neutro Abs: 11.9 10*3/uL — ABNORMAL HIGH (ref 1.7–7.7)
Neutrophils Relative %: 77 %
Platelets: 127 10*3/uL — ABNORMAL LOW (ref 150–400)
RBC: 3.25 MIL/uL — ABNORMAL LOW (ref 3.87–5.11)
RDW: 16.3 % — ABNORMAL HIGH (ref 11.5–15.5)
WBC: 15.3 10*3/uL — ABNORMAL HIGH (ref 4.0–10.5)
nRBC: 0 % (ref 0.0–0.2)

## 2019-03-20 LAB — URINE CULTURE: Culture: NO GROWTH

## 2019-03-20 LAB — MAGNESIUM: Magnesium: 2.4 mg/dL (ref 1.7–2.4)

## 2019-03-20 LAB — MRSA PCR SCREENING: MRSA by PCR: NEGATIVE

## 2019-03-20 MED ORDER — LACTULOSE 10 GM/15ML PO SOLN
10.0000 g | Freq: Every day | ORAL | Status: AC
  Administered 2019-03-20 – 2019-03-21 (×2): 10 g
  Filled 2019-03-20 (×2): qty 15

## 2019-03-20 MED ORDER — FAMOTIDINE 20 MG PO TABS
20.0000 mg | ORAL_TABLET | Freq: Every day | ORAL | Status: DC
  Administered 2019-03-20 – 2019-03-25 (×6): 20 mg
  Filled 2019-03-20 (×6): qty 1

## 2019-03-20 NOTE — Progress Notes (Addendum)
NAME:  Stephanie SkeenSusan M Chen, MRN:  [id number], DOB:  1956-03-14, LOS: 9 ADMISSION DATE:  02/23/2019, CONSULTATION DATE:  03/19/2019 REFERRING MD:  OSH, CHIEF COMPLAINT:  ETOH withdrawal  Brief History   63 yo female with acute etoh withdrawal and possible sepsis  63 yo with pmh copd, and etoh abuse who presented with ams to osh. Per limited records sent (pt unresponsive so all history is obtained from chart review). Pt has been confused x 2 weeks PTA.  Reportedly was seen by her pcp for the same confusion but it is unclear what occurred after that visit. Daughter had reported pt was having hallucinations, multiple falls and increased weakness. Over the past 7 days PTA utilizing her inhalers more frequently, although husband denies this.   Upon presentation to osh she was tremulous and concern over DT with acute etoh withdraw was raised. She was given multiple doses of ativan/valium and subsequently started on precedex. Husband via the phone stated  that she "may drink 2-3 beers a day" last drink was one day PTA   possibly (husband  unsure as he was at work  sure she did not drink anything on the pm PTA;  However, she was restless and "so confused" . AMS has been progressively worse x 24 h  PTA  and not sleeping well x 2 days. Denies sick contacts, fevers/chills/cough. She has been taking her regularly prescribed medications.   Upon arrival to our ICU  poorly responsive, not protecting airway and abdominally breathing. She is febrile but hemodynamically stable, intubated.     Past Medical History   Past Medical History:  Diagnosis Date  . Arthritis   . Asthma   . COPD (chronic obstructive pulmonary disease) (HCC)   . Fibromyalgia   . Pneumonia   . Shortness of breath dyspnea     Significant Hospital Events   8/18: [facility], stable 8/23 remained  encephalopathic 8/24 still with no purposeful interaction  Consults:  PCCM Neurology  Procedures:    Significant  Diagnostic Tests:  8/18 CT head IMPRESSION: No acute intracranial abnormality.  8/21 MRI IMPRESSION: 1. No acute finding including evidence of posterior reversible encephalopathy syndrome. 2. Moderate chronic small vessel ischemia.  Micro Data:  8/18 sars2: neg 8/18: blood cx: Negative 8/18 urine cx: Streptococcus viridans 8/18 resp cx: Normal flora 03/16/2019: Blood culture: Pending 03/19/2019 blood cultures x 2>> 03/19/2019 urine culture>> 03/19/2019 sputum culture>>  Antimicrobials:  Cefepime 8/18-8/23 vanc 8/18-8/22 Rocephin 8/23>> 03/19/2019 03/19/2019 vancomycin>> 03/19/2019 meropenem>>  Interim history/subjective:    03/20/2019 sedation held for 24 hours>> still remains heavily sedated  Objective   Blood pressure (!) 161/86, pulse (!) 105, temperature 98.8 F (37.1 C), temperature source Oral, resp. rate (!) 22, height 5\' 4"  (1.626 m), weight 103.5 kg, SpO2 94 %.    Vent Mode: PRVC FiO2 (%):  [40 %-50 %] 50 % Set Rate:  [14 bmp] 14 bmp Vt Set:  [500 mL] 500 mL PEEP:  [10 cmH20] 10 cmH20 Plateau Pressure:  [21 cmH20-26 cmH20] 24 cmH20   Intake/Output Summary (Last 24 hours) at 03/20/2019 0837 Last data filed at 03/20/2019 0600 Gross per 24 hour  Intake 3574.84 ml  Output 1550 ml  Net 2024.84 ml   Filed Weights   03/18/19 0500 03/19/19 0500 03/20/19 0500  Weight: 104.7 kg 103.9 kg 103.5 kg   Examination:  General: Disheveled female appearing older than her stated age   HEENT: Orogastric tube in place, endotracheal tube is  in place Neuro: Sedation has been held.  Poorly responsive barely responds to noxious stimulus CV: Heart sounds are regular but irregular at times.  Appears to be a multifocal atrial tachycardia PULM: Creased breath sounds at bases GI: soft, bsx4 active  Extremities: warm/dry, 2+ edema  Skin: no rashes or lesions     I personally reviewed images and agree with radiology impression as follows:  CXR:   03/20/19 Stable hardware positioning  and bilateral lower lobe opacification.    Resolved Hospital Problem list     Assessment & Plan:   Encephalopathy Plan: Holding sedation   Neurology signed off Decreased level consciousness and ? Peep dpe  may make it difficult to wean effectively much less tol extubate     Acute respiratory failure Plan: Holding sedation     Chronic obstructive pulmonary disease Plan: Continue pulmonary toilet   Hypertension Plan: Continue Norvasc and intermittent Metroprolol  Fever Strep viridans in urine Probable pneumonia Plan: Despite rx Rocephin   noted to have fever and elevated white count and was started on vancomycin and meropenem on 03/19/2019>  tmax 103 8/26 and afeb this am  Continue to monitor culture data as above  Transaminitis with hyperbilirubinemia We will continue to monitor  History of EtOH use Continue to hold sedation for decreased level of consciousness Continue multivitamins to prevent Wernicke's encephalopathy  Chronic pain/fibromyalgia/depression Patient was on high doses of oxycodone at home Opiates reinitiated at lower doses Stimulating antidepressants initiated-was on Cymbalta 60 at home Plan  All sedation currently on hold for decreased level of consciousness Neurology has signed off Monitor ammonia level noted as risen to 50 we will give lactulose for 2 days  Renal insufficiency Lab Results  Component Value Date   CREATININE 2.25 (H) 03/20/2019   CREATININE 1.68 (H) 03/19/2019   CREATININE 1.26 (H) 03/18/2019   Recent Labs  Lab 03/18/19 0148 03/19/19 0500 03/20/19 0353  K 3.9 4.2 4.1   Plan: 03/19/2020 not repeat Lasix today Continue to monitor BUN and creatinine  Best practice:  Diet: Vital Pain/Anxiety/Delirium protocol (if indicated): Opiates, currently on Versed drip with plan to change to Ativan and lighten sedation VAP protocol (if indicated): Per protocol DVT prophylaxis: Heparin GI prophylaxis:  Glucose control: n/a  Mobility: bedrest Code Status: Full code  Family Communication: 03/20/2019 no family bedside Disposition: ICU  Labs   CBC: Recent Labs  Lab 03/16/19 0923 03/17/19 0421 03/18/19 0148 03/19/19 0500 03/20/19 0353  WBC 15.4* 13.2* 14.5* 15.3* 15.3*  NEUTROABS 13.6*  --  11.6* 12.4* 11.9*  HGB 12.0 11.1* 10.8* 10.7* 10.0*  HCT 39.4 36.8 36.1 36.2 34.2*  MCV 103.1* 103.1* 104.3* 105.8* 105.2*  PLT 161 117* 112* 124* 127*    Basic Metabolic Panel: Recent Labs  Lab 03/14/19 0440 03/14/19 1714 03/15/19 0417  03/16/19 0923 03/17/19 0421 03/18/19 0148 03/18/19 0230 03/19/19 0500 03/20/19 0353  NA 133*  --   --   --  136 139 141  --  143 142  K 3.5  --   --   --  4.7 4.3 3.9  --  4.2 4.1  CL 101  --   --   --  104 108 111  --  112* 111  CO2 24  --   --   --  23 21* 24  --  23 20*  GLUCOSE 140*  --   --   --  172* 158* 114*  --  131* 130*  BUN 25*  --   --   --  54* 63* 66*  --  74* 90*  CREATININE 0.94  --  0.94   < > 1.12* 1.24* 1.26*  --  1.68* 2.25*  CALCIUM 7.7*  --   --   --  8.0* 7.8* 7.7*  --  7.6* 7.3*  MG 2.2 2.2 2.4  --   --   --   --  2.4 2.5* 2.4  PHOS 2.3* 3.1 3.4  --   --   --   --   --  3.4 3.7   < > = values in this interval not displayed.   GFR: Estimated Creatinine Clearance: 29.6 mL/min (A) (by C-G formula based on SCr of 2.25 mg/dL (H)). Recent Labs  Lab 03/14/19 0440  03/17/19 0421 03/18/19 0148 03/19/19 0500 03/20/19 0353  PROCALCITON 0.66  --   --   --   --   --   WBC 10.5   < > 13.2* 14.5* 15.3* 15.3*   < > = values in this interval not displayed.    Liver Function Tests: Recent Labs  Lab 03/14/19 0440 03/16/19 0923 03/17/19 0421 03/20/19 0353  AST 33 30 36 28  ALT 43 44 46* 36  ALKPHOS 78 85 61 81  BILITOT 1.7* 1.3* 1.0 0.7  PROT 6.8 7.6 6.2* 6.3*  ALBUMIN 2.9* 3.2* 2.5* 2.5*   No results for input(s): LIPASE, AMYLASE in the last 168 hours. Recent Labs  Lab 03/17/19 0926 03/19/19 1050  AMMONIA 42* 50*    ABG    Component  Value Date/Time   PHART 7.370 03/19/2019 1635   PCO2ART 41.6 03/19/2019 1635   PO2ART 60.6 (L) 03/19/2019 1635   HCO3 23.5 03/19/2019 1635   ACIDBASEDEF 1.2 03/19/2019 1635   O2SAT 90.9 03/19/2019 1635     Coagulation Profile: Recent Labs  Lab 03/17/19 1103  INR 1.1    Cardiac Enzymes: No results for input(s): CKTOTAL, CKMB, CKMBINDEX, TROPONINI in the last 168 hours.  HbA1C: No results found for: HGBA1C  CBG: Recent Labs  Lab 03/19/19 1653 03/19/19 2020 03/19/19 2337 03/20/19 0324 03/20/19 0805  GLUCAP 136* 133* 118* 122* 140*     The patient is critically ill with multiple organ systems failure and requires high complexity decision making for assessment and support, frequent evaluation and titration of therapies, application of advanced monitoring technologies and extensive interpretation of multiple databases. Critical Care Time devoted to patient care services described in this note is 40 minutes.     Christinia Gully, MD Pulmonary and [place] [phone] After 5:30 PM or weekends, use [facility] [phone]

## 2019-03-20 NOTE — TOC Initial Note (Signed)
Transition of Care [facility]) - Initial/Assessment Note    Patient Details  Name: Stephanie Chen MRN: [id number] Date of Birth: Jul 28, 1955  [facility]) CM/SW Contact:    Lynnell Catalan, RN Phone Number: 03/20/2019, 12:00 PM  Clinical Narrative:                 Pt sedated. TOC will continue to follow along for DC needs.  Expected Discharge Plan: Home/Self Care Barriers to Discharge: Continued Medical Work up  Expected Discharge Plan and Services Expected Discharge Plan: Home/Self Care    Prior Living Arrangements/Services   Lives with:: Spouse Patient language and need for interpreter reviewed:: Yes        Need for Family Participation in Patient Care: Yes (Comment) Care giver support system in place?: Yes (comment)      Activities of Daily Living Home Assistive Devices/Equipment: Eyeglasses, Cane (specify quad or straight), Walker (specify type) ADL Screening (condition at time of admission) Patient's cognitive ability adequate to safely complete daily activities?: No Is the patient deaf or have difficulty hearing?: No Does the patient have difficulty seeing, even when wearing glasses/contacts?: No Does the patient have difficulty concentrating, remembering, or making decisions?: Yes Patient able to express need for assistance with ADLs?: No Does the patient have difficulty dressing or bathing?: Yes Independently performs ADLs?: No Does the patient have difficulty walking or climbing stairs?: Yes Weakness of Legs: Both Weakness of Arms/Hands: Both  Permission Sought/Granted                  Emotional Assessment         Alcohol / Substance Use: Alcohol Use    Admission diagnosis:  COPD ALCOHOLISM Patient Active Problem List   Diagnosis Date Noted  . Alcohol withdrawal ([place]) 2019/03/28  . Spondylolisthesis of lumbar region 06/12/2014   PCP:  Ernestene Kiel, MD Pharmacy:   [place] [place]  [place] [address] Phone: [phone] Fax: [phone]  [facility] [place], [place] [address] [place] [address] Phone: [phone] Fax: [phone]     Social Determinants of Health (SDOH) Interventions    Readmission Risk Interventions Readmission Risk Prevention Plan 03/20/2019  Transportation Screening Complete  PCP or Specialist Appt within 3-5 Days Not Complete  Not Complete comments Not ready for DC  HRI or [place] Not Complete  HRI or Home Care Consult comments NA  Social Work Consult for [place] Planning/Counseling Not Complete  SW consult not completed comments Pt sedated  Palliative Care Screening Not Applicable  Medication Review (RN Care Manager) Complete  Some recent data might be hidden

## 2019-03-20 NOTE — Progress Notes (Signed)
Renal dose adjustment:  Pepcid 20 q12 decreased to 20 qhs for CrCl 29.6 ml/min  Eudelia Bunch, Pharm.D [phone] 03/20/2019 8:18 AM

## 2019-03-21 ENCOUNTER — Inpatient Hospital Stay (HOSPITAL_COMMUNITY): Payer: Commercial Managed Care - PPO

## 2019-03-21 LAB — GLUCOSE, CAPILLARY
Glucose-Capillary: 91 mg/dL (ref 70–99)
Glucose-Capillary: 119 mg/dL — ABNORMAL HIGH (ref 70–99)
Glucose-Capillary: 117 mg/dL — ABNORMAL HIGH (ref 70–99)
Glucose-Capillary: 112 mg/dL — ABNORMAL HIGH (ref 70–99)
Glucose-Capillary: 109 mg/dL — ABNORMAL HIGH (ref 70–99)
Glucose-Capillary: 122 mg/dL — ABNORMAL HIGH (ref 70–99)

## 2019-03-21 LAB — CBC WITH DIFFERENTIAL/PLATELET
Abs Immature Granulocytes: 0.14 10*3/uL — ABNORMAL HIGH (ref 0.00–0.07)
Basophils Absolute: 0 10*3/uL (ref 0.0–0.1)
Basophils Relative: 0 %
Eosinophils Absolute: 0.3 10*3/uL (ref 0.0–0.5)
Eosinophils Relative: 2 %
HCT: 35.1 % — ABNORMAL LOW (ref 36.0–46.0)
Hemoglobin: 10.2 g/dL — ABNORMAL LOW (ref 12.0–15.0)
Immature Granulocytes: 1 %
Lymphocytes Relative: 6 %
Lymphs Abs: 1 10*3/uL (ref 0.7–4.0)
MCH: 30.8 pg (ref 26.0–34.0)
MCHC: 29.1 g/dL — ABNORMAL LOW (ref 30.0–36.0)
MCV: 106 fL — ABNORMAL HIGH (ref 80.0–100.0)
Monocytes Absolute: 1.8 10*3/uL — ABNORMAL HIGH (ref 0.1–1.0)
Monocytes Relative: 12 %
Neutro Abs: 11.9 10*3/uL — ABNORMAL HIGH (ref 1.7–7.7)
Neutrophils Relative %: 79 %
Platelets: 113 10*3/uL — ABNORMAL LOW (ref 150–400)
RBC: 3.31 MIL/uL — ABNORMAL LOW (ref 3.87–5.11)
RDW: 16 % — ABNORMAL HIGH (ref 11.5–15.5)
WBC: 15.1 10*3/uL — ABNORMAL HIGH (ref 4.0–10.5)
nRBC: 0 % (ref 0.0–0.2)

## 2019-03-21 LAB — MAGNESIUM: Magnesium: 2.6 mg/dL — ABNORMAL HIGH (ref 1.7–2.4)

## 2019-03-21 LAB — CULTURE, BLOOD (ROUTINE X 2)
Culture: NO GROWTH
Culture: NO GROWTH
Special Requests: ADEQUATE
Special Requests: ADEQUATE

## 2019-03-21 LAB — BASIC METABOLIC PANEL
Anion gap: 8 (ref 5–15)
BUN: 112 mg/dL — ABNORMAL HIGH (ref 8–23)
CO2: 23 mmol/L (ref 22–32)
Calcium: 7.8 mg/dL — ABNORMAL LOW (ref 8.9–10.3)
Chloride: 115 mmol/L — ABNORMAL HIGH (ref 98–111)
Creatinine, Ser: 2.88 mg/dL — ABNORMAL HIGH (ref 0.44–1.00)
GFR calc Af Amer: 19 mL/min — ABNORMAL LOW (ref >60)
GFR calc non Af Amer: 17 mL/min — ABNORMAL LOW (ref >60)
Glucose, Bld: 129 mg/dL — ABNORMAL HIGH (ref 70–99)
Potassium: 4.7 mmol/L (ref 3.5–5.1)
Sodium: 146 mmol/L — ABNORMAL HIGH (ref 135–145)

## 2019-03-21 LAB — CULTURE, RESPIRATORY W GRAM STAIN

## 2019-03-21 LAB — PHOSPHORUS: Phosphorus: 5.6 mg/dL — ABNORMAL HIGH (ref 2.5–4.6)

## 2019-03-21 LAB — AMMONIA: Ammonia: 64 umol/L — ABNORMAL HIGH (ref 9–35)

## 2019-03-21 MED ORDER — CLONIDINE HCL 0.1 MG PO TABS
0.1000 mg | ORAL_TABLET | Freq: Three times a day (TID) | ORAL | Status: DC
  Administered 2019-03-21 – 2019-03-25 (×7): 0.1 mg
  Filled 2019-03-21 (×7): qty 1

## 2019-03-21 MED ORDER — LORAZEPAM 2 MG/ML IJ SOLN
0.5000 mg | Freq: Four times a day (QID) | INTRAMUSCULAR | Status: DC
  Administered 2019-03-21: 0.5 mg via INTRAVENOUS
  Filled 2019-03-21: qty 1

## 2019-03-21 MED ORDER — DEXMEDETOMIDINE HCL IN NACL 200 MCG/50ML IV SOLN
0.4000 ug/kg/h | INTRAVENOUS | Status: DC
  Administered 2019-03-21 (×2): 0.4 ug/kg/h via INTRAVENOUS
  Administered 2019-03-22: 0.5 ug/kg/h via INTRAVENOUS
  Administered 2019-03-22: 0.6 ug/kg/h via INTRAVENOUS
  Administered 2019-03-22 (×2): 1.2 ug/kg/h via INTRAVENOUS
  Administered 2019-03-22: 1.1 ug/kg/h via INTRAVENOUS
  Administered 2019-03-22: 0.8 ug/kg/h via INTRAVENOUS
  Filled 2019-03-21: qty 50
  Filled 2019-03-21: qty 100
  Filled 2019-03-21 (×3): qty 50
  Filled 2019-03-21: qty 100

## 2019-03-21 NOTE — Progress Notes (Signed)
eLink Physician-Brief Progress Note Patient Name: Stephanie Chen DOB: August 12, 1955 MRN: [id number]   Date of Service  03/21/2019  HPI/Events of Note  Agitation - Request to renew bilateral soft wrist restraints.   eICU Interventions  Will renew bilateral soft wrist restraints.      Intervention Category Major Interventions: Delirium, psychosis, severe agitation - evaluation and management  Sommer,Steven Eugene 03/21/2019, 11:07 PM

## 2019-03-21 NOTE — Progress Notes (Addendum)
NAME:  Stephanie SkeenSusan M Chen, MRN:  [id number], DOB:  Mar 10, 1956, LOS: 10 ADMISSION DATE:  02/26/2019, CONSULTATION DATE:  02/24/2019 REFERRING MD:  OSH, CHIEF COMPLAINT:  ETOH withdrawal  Brief History   63 yo female with acute etoh withdrawal and possible sepsis  63 yo with pmh copd, and etoh abuse who presented with ams to osh. Per limited records sent (pt unresponsive so all history is obtained from chart review). Pt has been confused x 2 weeks PTA.  Reportedly was seen by her pcp for the same confusion but it is unclear what occurred after that visit. Daughter had reported pt was having hallucinations, multiple falls and increased weakness. Over the past 7 days PTA  ? utilizing her inhalers more frequently, although husband denied this.   Upon presentation to osh she was tremulous and concern over DTs with acute etoh withdraw was raised. She was given multiple doses of ativan/valium and subsequently started on precedex. Husband via the phone stated  that she "may drink 2-3 beers a day" last drink was one day PTA   possibly (husband  unsure as he was at work  sure she did not drink anything on the pm PTA);  However, she was restless and "so confused" . AMS had been progressively worse x 24 h  PTA  and not sleeping well x 2 days. Deniedsick contacts, fevers/chills/cough. She has been taking her regularly prescribed medications.   Upon arrival to our ICU  poorly responsive, not protecting airway and abdominally breathing  febrile but hemodynamically stable, intubated.     Past Medical History   Past Medical History:  Diagnosis Date  . Arthritis   . Asthma   . COPD (chronic obstructive pulmonary disease) (HCC)   . Fibromyalgia   . Pneumonia   . Shortness of breath dyspnea     Significant Hospital Events   8/18: [facility], stable 8/23 remained  encephalopathic 8/24 still with no purposeful interaction  Consults:  PCCM Neurology signed off 8/26, phone  reconsult  8/28 re persistent AMS  Procedures:     Significant Diagnostic Tests:  8/18 CT head IMPRESSION: No acute intracranial abnormality.  8/21 MRI IMPRESSION: 1. No acute finding including evidence of posterior reversible encephalopathy syndrome. 2. Moderate chronic small vessel ischemia. EEG 8/24 No sz    Micro Data:  8/18 sars2: neg 8/18: blood cx: Negative 8/18 urine cx: Streptococcus viridans 8/18 resp cx: Normal flora 03/16/2019: Blood culture: Pending 03/19/2019 blood cultures x 2>> 03/19/2019 urine culture>> negative 03/19/2019 sputum culture>>  Antimicrobials:  Cefepime 8/18-8/23 vanc 8/18-8/22 Rocephin 8/23>> 03/19/2019 03/19/2019 vancomycin>> 03/19/2019 meropenem>>  Interim history/subjective:   receiving ativan scheduled, temps down since 8/26   Objective   Blood pressure (!) 144/89, pulse 77, temperature 97.9 F (36.6 C), temperature source Axillary, resp. rate (!) 0, height 5\' 4"  (1.626 m), weight 103.6 kg, SpO2 96 %.    Vent Mode: PRVC FiO2 (%):  [40 %-50 %] 40 % Set Rate:  [14 bmp] 14 bmp Vt Set:  [500 mL] 500 mL PEEP:  [10 cmH20] 10 cmH20 Plateau Pressure:  [20 cmH20-25 cmH20] 25 cmH20   Intake/Output Summary (Last 24 hours) at 03/21/2019 0831 Last data filed at 03/21/2019 0700 Gross per 24 hour  Intake 2205.51 ml  Output 650 ml  Net 1555.51 ml   Filed Weights   03/19/19 0500 03/20/19 0500 03/21/19 0449  Weight: 103.9 kg 103.5 kg 103.6 kg   Examination:  General: elderly  female looks  older than stated age HEENT: Endotracheal tube gastric tube in place.  Pupils equal react light Neuro: Other than rhythmic movement no follows commands CV: Heart sounds are regular sinus rhythm at 92  PULM: Decreased breath sounds in the bases GI: soft, bsx4 active  Extremities: warm/dry 2+ edema  Skin: no rashes or lesions          Resolved Hospital Problem list     Assessment & Plan:   Encephalopathy Plan: Stopped  all scheduled narcotic  -  discussed with neuro 8/28 rec ativan 0.5 q 6 and d/c wellbutrin/prozac     Acute respiratory failure Plan: Vent bundle Altered mental status precludes extubation       Hypertension Plan: Continue Norvasc and intermittent Metroprolol    Fever Strep viridans in urine Probable HCAP Plan: Started on vancomycin and meropenem on 03/19/2019 No positive culture data other than viridans    Transaminitis with hyperbilirubinemia Resolved  History of EtOH use Stop all narcotics except for PRN fentanyl Continue multivitamins On maint Ativan but weaned to 0.5 q 6 as of am 8/28  Chronic pain/fibromyalgia/depression Patient was on high doses of oxycodone at home Opiates reinitiated at lower doses Stimulating antidepressants initiated-was on Cymbalta 60 at home Plan  Requiring PRN Ativan for rhythmic movements Neurology signed off 8/27 03/21/2019   stop all scheduled narcotic Leave PRN fentanyl    Renal insufficiency Lab Results  Component Value Date   CREATININE 2.88 (H) 03/21/2019   CREATININE 2.25 (H) 03/20/2019   CREATININE 1.68 (H) 03/19/2019   Recent Labs  Lab 03/19/19 0500 03/20/19 0353 03/21/19 0412  K 4.2 4.1 4.7   Plan: Continue to hold Lasix with rising creatinine Continue to monitor BUN and creatinine  Best practice:  Diet: Vital Pain/Anxiety/Delirium protocol (if indicated): Opiates, currently on Versed drip with plan to change to Ativan and lighten sedation VAP protocol (if indicated): Per protocol DVT prophylaxis: Heparin GI prophylaxis:  Glucose control: n/a Mobility: bedrest Code Status: Full code  Family Communication: 03/21/2019 no family at bedside Disposition: ICU  Labs   CBC: Recent Labs  Lab 03/16/19 0923 03/17/19 0421 03/18/19 0148 03/19/19 0500 03/20/19 0353 03/21/19 0412  WBC 15.4* 13.2* 14.5* 15.3* 15.3* 15.1*  NEUTROABS 13.6*  --  11.6* 12.4* 11.9* 11.9*  HGB 12.0 11.1* 10.8* 10.7* 10.0* 10.2*  HCT 39.4 36.8 36.1 36.2  34.2* 35.1*  MCV 103.1* 103.1* 104.3* 105.8* 105.2* 106.0*  PLT 161 117* 112* 124* 127* 113*    Basic Metabolic Panel: Recent Labs  Lab 03/14/19 1714 03/15/19 0417  03/17/19 0421 03/18/19 0148 03/18/19 0230 03/19/19 0500 03/20/19 0353 03/21/19 0412  NA  --   --    < > 139 141  --  143 142 146*  K  --   --    < > 4.3 3.9  --  4.2 4.1 4.7  CL  --   --    < > 108 111  --  112* 111 115*  CO2  --   --    < > 21* 24  --  23 20* 23  GLUCOSE  --   --    < > 158* 114*  --  131* 130* 129*  BUN  --   --    < > 63* 66*  --  74* 90* 112*  CREATININE  --  0.94   < > 1.24* 1.26*  --  1.68* 2.25* 2.88*  CALCIUM  --   --    < >  7.8* 7.7*  --  7.6* 7.3* 7.8*  MG 2.2 2.4  --   --   --  2.4 2.5* 2.4 2.6*  PHOS 3.1 3.4  --   --   --   --  3.4 3.7 5.6*   < > = values in this interval not displayed.   GFR: Estimated Creatinine Clearance: 23.1 mL/min (A) (by C-G formula based on SCr of 2.88 mg/dL (H)). Recent Labs  Lab 03/18/19 0148 03/19/19 0500 03/20/19 0353 03/21/19 0412  WBC 14.5* 15.3* 15.3* 15.1*    Liver Function Tests: Recent Labs  Lab 03/16/19 0923 03/17/19 0421 03/20/19 0353  AST 30 36 28  ALT 44 46* 36  ALKPHOS 85 61 81  BILITOT 1.3* 1.0 0.7  PROT 7.6 6.2* 6.3*  ALBUMIN 3.2* 2.5* 2.5*   No results for input(s): LIPASE, AMYLASE in the last 168 hours. Recent Labs  Lab 03/17/19 0926 03/19/19 1050  AMMONIA 42* 50*    ABG    Component Value Date/Time   PHART 7.370 03/19/2019 1635   PCO2ART 41.6 03/19/2019 1635   PO2ART 60.6 (L) 03/19/2019 1635   HCO3 23.5 03/19/2019 1635   ACIDBASEDEF 1.2 03/19/2019 1635   O2SAT 90.9 03/19/2019 1635     Coagulation Profile: Recent Labs  Lab 03/17/19 1103  INR 1.1    Cardiac Enzymes: No results for input(s): CKTOTAL, CKMB, CKMBINDEX, TROPONINI in the last 168 hours.  HbA1C: No results found for: HGBA1C  CBG: Recent Labs  Lab 03/20/19 1558 03/20/19 1954 03/20/19 2328 03/21/19 0315 03/21/19 0740  GLUCAP 124* 130*  117* 91 119*     App cct 30 min  Richardson Landry Minor ACNP Maryanna [facility] PCCM Pager [phone] till 1 pm If no answer page [phone] 03/21/2019, 8:31 AM

## 2019-03-22 ENCOUNTER — Inpatient Hospital Stay (HOSPITAL_COMMUNITY): Payer: Commercial Managed Care - PPO

## 2019-03-22 DIAGNOSIS — J189 Pneumonia, unspecified organism: Secondary | ICD-10-CM | POA: Diagnosis present

## 2019-03-22 DIAGNOSIS — D696 Thrombocytopenia, unspecified: Secondary | ICD-10-CM | POA: Diagnosis not present

## 2019-03-22 DIAGNOSIS — N179 Acute kidney failure, unspecified: Secondary | ICD-10-CM | POA: Diagnosis not present

## 2019-03-22 DIAGNOSIS — I10 Essential (primary) hypertension: Secondary | ICD-10-CM | POA: Diagnosis present

## 2019-03-22 LAB — BASIC METABOLIC PANEL
Anion gap: 10 (ref 5–15)
BUN: 123 mg/dL — ABNORMAL HIGH (ref 8–23)
CO2: 20 mmol/L — ABNORMAL LOW (ref 22–32)
Calcium: 7.2 mg/dL — ABNORMAL LOW (ref 8.9–10.3)
Chloride: 117 mmol/L — ABNORMAL HIGH (ref 98–111)
Creatinine, Ser: 3.14 mg/dL — ABNORMAL HIGH (ref 0.44–1.00)
GFR calc Af Amer: 17 mL/min — ABNORMAL LOW (ref >60)
GFR calc non Af Amer: 15 mL/min — ABNORMAL LOW (ref >60)
Glucose, Bld: 113 mg/dL — ABNORMAL HIGH (ref 70–99)
Potassium: 4.8 mmol/L (ref 3.5–5.1)
Sodium: 147 mmol/L — ABNORMAL HIGH (ref 135–145)

## 2019-03-22 LAB — CBC WITH DIFFERENTIAL/PLATELET
Abs Immature Granulocytes: 0.06 10*3/uL (ref 0.00–0.07)
Basophils Absolute: 0 10*3/uL (ref 0.0–0.1)
Basophils Relative: 0 %
Eosinophils Absolute: 0.2 10*3/uL (ref 0.0–0.5)
Eosinophils Relative: 2 %
HCT: 30.1 % — ABNORMAL LOW (ref 36.0–46.0)
Hemoglobin: 8.8 g/dL — ABNORMAL LOW (ref 12.0–15.0)
Immature Granulocytes: 1 %
Lymphocytes Relative: 6 %
Lymphs Abs: 0.7 10*3/uL (ref 0.7–4.0)
MCH: 31.3 pg (ref 26.0–34.0)
MCHC: 29.2 g/dL — ABNORMAL LOW (ref 30.0–36.0)
MCV: 107.1 fL — ABNORMAL HIGH (ref 80.0–100.0)
Monocytes Absolute: 1.2 10*3/uL — ABNORMAL HIGH (ref 0.1–1.0)
Monocytes Relative: 12 %
Neutro Abs: 8.4 10*3/uL — ABNORMAL HIGH (ref 1.7–7.7)
Neutrophils Relative %: 79 %
Platelets: 91 10*3/uL — ABNORMAL LOW (ref 150–400)
RBC: 2.81 MIL/uL — ABNORMAL LOW (ref 3.87–5.11)
RDW: 16.1 % — ABNORMAL HIGH (ref 11.5–15.5)
WBC: 10.6 10*3/uL — ABNORMAL HIGH (ref 4.0–10.5)
nRBC: 0 % (ref 0.0–0.2)

## 2019-03-22 LAB — GLUCOSE, CAPILLARY
Glucose-Capillary: 111 mg/dL — ABNORMAL HIGH (ref 70–99)
Glucose-Capillary: 116 mg/dL — ABNORMAL HIGH (ref 70–99)
Glucose-Capillary: 130 mg/dL — ABNORMAL HIGH (ref 70–99)
Glucose-Capillary: 132 mg/dL — ABNORMAL HIGH (ref 70–99)
Glucose-Capillary: 128 mg/dL — ABNORMAL HIGH (ref 70–99)
Glucose-Capillary: 136 mg/dL — ABNORMAL HIGH (ref 70–99)

## 2019-03-22 LAB — MAGNESIUM: Magnesium: 2.3 mg/dL (ref 1.7–2.4)

## 2019-03-22 LAB — PHOSPHORUS: Phosphorus: 5.7 mg/dL — ABNORMAL HIGH (ref 2.5–4.6)

## 2019-03-22 MED ORDER — HALOPERIDOL LACTATE 5 MG/ML IJ SOLN
2.0000 mg | Freq: Once | INTRAMUSCULAR | Status: AC
  Administered 2019-03-22: 2 mg via INTRAVENOUS
  Filled 2019-03-22: qty 1

## 2019-03-22 MED ORDER — SODIUM CHLORIDE 0.9 % IV BOLUS
1000.0000 mL | Freq: Once | INTRAVENOUS | Status: AC
  Administered 2019-03-22: 1000 mL via INTRAVENOUS

## 2019-03-22 MED ORDER — LACTULOSE 10 GM/15ML PO SOLN
10.0000 g | Freq: Two times a day (BID) | ORAL | Status: AC
  Administered 2019-03-22 (×2): 10 g
  Filled 2019-03-22 (×3): qty 15

## 2019-03-22 MED ORDER — DEXMEDETOMIDINE HCL IN NACL 200 MCG/50ML IV SOLN: 0.4000 ug/kg/h | INTRAVENOUS | Status: DC

## 2019-03-22 MED ORDER — DEXTROSE IN LACTATED RINGERS 5 % IV SOLN
INTRAVENOUS | Status: DC
  Administered 2019-03-22: 09:00:00 1000 mL via INTRAVENOUS
  Administered 2019-03-22 – 2019-03-23 (×2): via INTRAVENOUS

## 2019-03-22 MED ORDER — PHENYLEPHRINE HCL-NACL 10-0.9 MG/250ML-% IV SOLN
0.0000 ug/min | INTRAVENOUS | Status: DC
  Administered 2019-03-22 (×2): 20 ug/min via INTRAVENOUS
  Administered 2019-03-23: 5 ug/min via INTRAVENOUS
  Filled 2019-03-22: qty 250

## 2019-03-22 MED ORDER — HALOPERIDOL LACTATE 5 MG/ML IJ SOLN
5.0000 mg | Freq: Once | INTRAMUSCULAR | Status: AC
  Administered 2019-03-22: 5 mg via INTRAVENOUS
  Filled 2019-03-22: qty 1

## 2019-03-22 MED ORDER — DEXMEDETOMIDINE HCL IN NACL 400 MCG/100ML IV SOLN
0.4000 ug/kg/h | INTRAVENOUS | Status: DC
  Administered 2019-03-22: 1 ug/kg/h via INTRAVENOUS
  Administered 2019-03-22: 23:00:00 1.4 ug/kg/h via INTRAVENOUS
  Administered 2019-03-22 – 2019-03-23 (×2): 1.1 ug/kg/h via INTRAVENOUS
  Administered 2019-03-23 (×6): 1.5 ug/kg/h via INTRAVENOUS
  Administered 2019-03-23: 0.731 ug/kg/h via INTRAVENOUS
  Administered 2019-03-24 (×2): 1.7 ug/kg/h via INTRAVENOUS
  Administered 2019-03-24: 1.6 ug/kg/h via INTRAVENOUS
  Administered 2019-03-24: 0.731 ug/kg/h via INTRAVENOUS
  Administered 2019-03-24 (×4): 1.7 ug/kg/h via INTRAVENOUS
  Administered 2019-03-24: 1.5 ug/kg/h via INTRAVENOUS
  Administered 2019-03-24: 1.7 ug/kg/h via INTRAVENOUS
  Administered 2019-03-24: 1 ug/kg/h via INTRAVENOUS
  Administered 2019-03-25 (×4): 1.7 ug/kg/h via INTRAVENOUS
  Administered 2019-03-25: 1.6 ug/kg/h via INTRAVENOUS
  Filled 2019-03-22 (×2): qty 100
  Filled 2019-03-22 (×2): qty 200
  Filled 2019-03-22 (×13): qty 100
  Filled 2019-03-22: qty 200
  Filled 2019-03-22 (×8): qty 100

## 2019-03-22 MED ORDER — FENTANYL CITRATE (PF) 100 MCG/2ML IJ SOLN: 50.0000 ug | Freq: Once | INTRAMUSCULAR | Status: DC

## 2019-03-22 NOTE — Progress Notes (Signed)
eLink Physician-Brief Progress Note Patient Name: Stephanie Chen DOB: Jan 03, 1956 MRN: [id number]   Date of Service  03/22/2019  HPI/Events of Note  Multiple issues: 1. Severe agitation - QTc interval = 0.38 seconds and 2. Request to hold Catapres d/t hypotension after administration.   eICU Interventions  Will order: 1. Haldol 2 mg IV now.  2. Increase ceiling on Precedex IV infusion to 1.7 mcg/kg/hour.  3. Hold Catapres until patient is evaluated by rounding team in AM.      Intervention Category Major Interventions: Delirium, psychosis, severe agitation - evaluation and management  Aleeza [place] 03/22/2019, 10:20 PM

## 2019-03-22 NOTE — Progress Notes (Signed)
Pt was I&O cathed at midnight with 350 output. RN bladder scanned pt at 0600 and pt had 150 in bladder, no output in external catheter. Will continue to monitor and report to day shift nurse.

## 2019-03-22 NOTE — Progress Notes (Signed)
Pt increasing agitated, thrashing head, popping of vent. RN titrated precedex up and maxed pt out per [employer]. RN had given PRN fentanyl but was not helping. Once pt was maxed on precedex pt calmed down, however pt BP dropped to 70s/40s. RN called [facility] and Dr. Emmit Alexanders put in orders for 1066ml NS bolus, and neo if the bolus does not help. Bolus did not help so RN started neo at 20. Will continue to monitor.

## 2019-03-22 NOTE — Progress Notes (Addendum)
NAME:  Stephanie Chen, MRN:  [id number], DOB:  Nov 06, 1955, LOS: 11 ADMISSION DATE:  03/05/2019, CONSULTATION DATE:  02/28/2019 REFERRING MD:  OSH, CHIEF COMPLAINT:  ETOH withdrawal  Brief History   63 yowf with acute etoh withdrawal and possible sepsis  pmh copd, and etoh abuse who presented with ams to osh. Per limited records sent (pt unresponsive so all history is obtained from chart review). Pt  confused x 2 weeks PTA.  Reportedly seen by her pcp for the same confusion but it is unclear what occurred after that visit. Daughter had reported pt was having hallucinations, multiple falls and increased weakness. Over the past 7 days PTA  ? utilizing her inhalers more frequently, although husband denied this.   Upon presentation to osh she was tremulous and concern over DTs with acute etoh withdraw was raised. She was given multiple doses of ativan/valium and subsequently started on precedex. Husband via the phone stated  that she "may drink 2-3 beers a day" last drink was one day PTA   possibly (husband  unsure as he was at work but  sure she did not drink anything on the pm PTA);  however, she was restless and "so confused" . AMS had been progressively worse x 24 h  PTA  and not sleeping well x 2 days. Denieds ick contacts, fevers/chills/cough - had been taking her regularly prescribed medications.   Upon arrival to our ICU  poorly responsive, not protecting airway and abdominally breathing  febrile but hemodynamically stable, intubated.     Past Medical History   Past Medical History:  Diagnosis Date  . Arthritis   . Asthma   . COPD (chronic obstructive pulmonary disease) (HCC)   . Fibromyalgia   . Pneumonia   . Shortness of breath dyspnea     Significant Hospital Events   8/18: transferred from OSH to [facility] On vent, stable 8/23 remained  encephalopathic 8/24 still with no purposeful interaction  Consults:  PCCM Neurology signed off 8/26, phone  reconsult 8/28 re persistent AMS: dx  TME / chronic drugs need to all was out  Procedures:  Oral ET  8/18 >>>  Significant Diagnostic Tests:  8/18 CT head IMPRESSION: No acute intracranial abnormality. 8/18  Echo    8/21 MRI IMPRESSION: 1. No acute finding including evidence of posterior reversible encephalopathy syndrome. 2. Moderate chronic small vessel ischemia. EEG 8/24 No sz    Micro Data:  8/18 sars2: neg 8/18: blood cx: Neg  8/18 urine cx: Streptococcus viridans 8/18 resp cx: Normal flora 03/16/2019: Blood culture x 2 neg 03/19/2019 blood cultures x 2>> 03/19/2019 urine culture>> negative 03/19/2019 sputum culture>> mod wbcs no org seen >>>  Antimicrobials:  Cefepime 8/18-8/23 vanc 8/18-8/22 Rocephin 8/23>> 03/19/2019 03/19/2019 vancomycin>> 03/19/2019 meropenem>>  Interim history/subjective:  Changed over to just precedex/clonidine  but agitated and dropped bp at max doses precedex   Objective   Blood pressure (!) 92/57, pulse 76, temperature 98.6 F (37 C), temperature source Axillary, resp. rate (!) 29, height 5\' 4"  (1.626 m), weight 109.4 kg, SpO2 94 %.    Vent Mode: CPAP;PSV FiO2 (%):  [40 %] 40 % Set Rate:  [14 bmp] 14 bmp Vt Set:  [500 mL] 500 mL PEEP:  [5 cmH20-10 cmH20] 5 cmH20 Pressure Support:  [10 cmH20] 10 cmH20 Plateau Pressure:  [19 cmH20-25 cmH20] 20 cmH20   Intake/Output Summary (Last 24 hours) at 03/22/2019 0816 Last data filed at 03/22/2019 0600 Gross per 24 hour  Intake 1856.54 ml  Output 690 ml  Net 1166.54 ml   Filed Weights   03/20/19 0500 03/21/19 0449 03/22/19 0500  Weight: 103.5 kg 103.6 kg 109.4 kg     Examination:   Pt agitated, not resp to verbal but moving all 4  No jvd Oropharynx oral et  Neck supple Lungs with a few scattered exp > insp rhonchi bilaterally RRR no s3 or or sign murmur Abd relatively soft / tol tf at goal / no bm  Extr warm with no edema or clubbing noted/ pas in place, min pitting       I personally reviewed images   impression as  follows:  pCXR:     CM with decreaesed aeration L >> R base ? As dz L base    Resolved Hospital Problem list     Assessment & Plan:   Encephalopathy 8/21 MRI 1. No acute finding including evidence of posterior reversible encephalopathy syndrome. 2. Moderate chronic small vessel ischemia. -EEG 8/24 No sz  -neuro phone consult 8/28 ok with continue to  wean off ativan/ rx precedex /clonidine Plan: Stopped  all scheduled narcotics 8/28 and placed back on precedex/ clonidine but bp did not tolerate (see hypertension) as on norvasc/lopressor       Acute respiratory failure/vent dep Plan: Vent bundle Altered mental status precludes extubation, probably needs trach next week as now day #11       Hypertension Plan: D/c norvasc and metaprolol 8/29 so we can use max precedex/clonidine     Fever/ probable HCAP  Strep viridans in urine Probable HCAP Plan: Started on vancomycin and meropenem on 03/19/2019 and defervesced  No positive culture data other than viridans as per micro flow sheet above    Transaminitis with hyperbilirubinemia Resolved  History of EtOH use Stopped all narcotics except for PRN fentanyl on 8/28  Continue multivitamins On maint Ativan but weaned to 0.5 q 6h as of am 8/28    Chronic pain/fibromyalgia/depression Patient was on high doses of oxycodone at home Stimulating antidepressants initiated-was on Cymbalta 60 at home Plan  Neurology signed off 8/27 03/21/2019   stopped all scheduled narcotics Leave PRN fentanyl Using clonidine/ precedex for w/d potential     Renal insufficiency Lab Results  Component Value Date   CREATININE 3.14 (H) 03/22/2019   CREATININE 2.88 (H) 03/21/2019   CREATININE 2.25 (H) 03/20/2019   Recent Labs  Lab 03/20/19 0353 03/21/19 0412 03/22/19 0447  K 4.1 4.7 4.8   Plan: Continue to hold Lasix   Continue to monitor BUN and creatinine Place foley back in for closer monitoring am 8/29    Mitral  regurgitation with Mod LAE - see echo  03/12/19   Thrombocytopenia ? Chronic  - not present on admit/ ? From sepsis > monitor for now on sq hep  Best practice:  Diet: Vital at goal Pain/Anxiety/Delirium protocol (if indicated):  Pecedex/ low dose ative/ prn fent VAP protocol (if indicated): Per protocol DVT prophylaxis: Heparin sq GI prophylaxis: h2 Glucose control: n/a Mobility: bedrest Code Status: Full code  Family Communication: 03/21/2019  Discussed at length with husband Disposition: ICU  Labs   CBC: Recent Labs  Lab 03/18/19 0148 03/19/19 0500 03/20/19 0353 03/21/19 0412 03/22/19 0447  WBC 14.5* 15.3* 15.3* 15.1* 10.6*  NEUTROABS 11.6* 12.4* 11.9* 11.9* 8.4*  HGB 10.8* 10.7* 10.0* 10.2* 8.8*  HCT 36.1 36.2 34.2* 35.1* 30.1*  MCV 104.3* 105.8* 105.2* 106.0* 107.1*  PLT 112* 124* 127* 113* 91*    Basic Metabolic Panel:  Recent Labs  Lab 03/18/19 0148 03/18/19 0230 03/19/19 0500 03/20/19 0353 03/21/19 0412 03/22/19 0447  NA 141  --  143 142 146* 147*  K 3.9  --  4.2 4.1 4.7 4.8  CL 111  --  112* 111 115* 117*  CO2 24  --  23 20* 23 20*  GLUCOSE 114*  --  131* 130* 129* 113*  BUN 66*  --  74* 90* 112* 123*  CREATININE 1.26*  --  1.68* 2.25* 2.88* 3.14*  CALCIUM 7.7*  --  7.6* 7.3* 7.8* 7.2*  MG  --  2.4 2.5* 2.4 2.6* 2.3  PHOS  --   --  3.4 3.7 5.6* 5.7*   GFR: Estimated Creatinine Clearance: 21.9 mL/min (A) (by C-G formula based on SCr of 3.14 mg/dL (H)). Recent Labs  Lab 03/19/19 0500 03/20/19 0353 03/21/19 0412 03/22/19 0447  WBC 15.3* 15.3* 15.1* 10.6*    Liver Function Tests: Recent Labs  Lab 03/16/19 0923 03/17/19 0421 03/20/19 0353  AST 30 36 28  ALT 44 46* 36  ALKPHOS 85 61 81  BILITOT 1.3* 1.0 0.7  PROT 7.6 6.2* 6.3*  ALBUMIN 3.2* 2.5* 2.5*   No results for input(s): LIPASE, AMYLASE in the last 168 hours. Recent Labs  Lab 03/17/19 0926 03/19/19 1050 03/21/19 1017  AMMONIA 42* 50* 64*    ABG    Component Value Date/Time    PHART 7.370 03/19/2019 1635   PCO2ART 41.6 03/19/2019 1635   PO2ART 60.6 (L) 03/19/2019 1635   HCO3 23.5 03/19/2019 1635   ACIDBASEDEF 1.2 03/19/2019 1635   O2SAT 90.9 03/19/2019 1635     Coagulation Profile: Recent Labs  Lab 03/17/19 1103  INR 1.1    Cardiac Enzymes: No results for input(s): CKTOTAL, CKMB, CKMBINDEX, TROPONINI in the last 168 hours.  HbA1C: No results found for: HGBA1C  CBG: Recent Labs  Lab 03/21/19 1521 03/21/19 1938 03/21/19 2321 03/22/19 0342 03/22/19 0722  GLUCAP 112* 109* 122* 111* 116*       The patient is critically ill with multiple organ systems failure and requires high complexity decision making for assessment and support, frequent evaluation and titration of therapies, application of advanced monitoring technologies and extensive interpretation of multiple databases. Critical Care Time devoted to patient care services described in this note is 45 minutes.    Sandrea HughsMichael Wert, MD Pulmonary and Critical Care Medicine [facility] Cell [phone] After 5:30 PM or weekends, use Beeper [phone]

## 2019-03-22 NOTE — Progress Notes (Signed)
Pt agitated in bed, not following any commands, chewing on ETT, shaking head side to side.  Fentanyl given at 2019, no improvement.  Precedex titrating up to max rate of 1.2 mg/kg/hr.

## 2019-03-22 NOTE — Progress Notes (Signed)
eLink Physician-Brief Progress Note Patient Name: Stephanie Chen DOB: 1956-01-24 MRN: [id number]   Date of Service  03/22/2019  HPI/Events of Note  Hypotension - BP = 79/46 with MAP = 56. Likely d/t sedation with Precedex IV infusion. LVEF = 55-60%,  eICU Interventions  Will order: 1. Bolus with 0.9 NaCl 1 liter IV over 1 hour now.  2. If fluid not effective in raising BP, Phenylephrine IV infusion. Titrate to MAP > 65.      Intervention Category Major Interventions: Hypotension - evaluation and management  Airi Copado Eugene 03/22/2019, 3:40 AM

## 2019-03-22 NOTE — Progress Notes (Signed)
Pharmacy Antibiotic Note  Stephanie Chen is a 63 y.o. female admitted on 03/08/2019 with sepsis.  Pharmacy has been consulted for meropenem dosing. 03/22/2019 Day #11 full abx Day # 4 meropenem - abx escalated 8/26 w/ fevers AF, WBC down to 10.5 SCr rising daily, 3.14 today No significant culture data  Plan: Continue meropenem 1 gm IV q12 ? De-escalate to cefepime?  Height: 5\' 4"  (162.6 cm) Weight: 241 lb 2.9 oz (109.4 kg) IBW/kg (Calculated) : 54.7  Temp (24hrs), Avg:98.9 F (37.2 C), Min:98.6 F (37 C), Max:99.4 F (37.4 C)  Recent Labs  Lab 03/18/19 0148 03/19/19 0500 03/20/19 0353 03/21/19 0412 03/22/19 0447  WBC 14.5* 15.3* 15.3* 15.1* 10.6*  CREATININE 1.26* 1.68* 2.25* 2.88* 3.14*    Estimated Creatinine Clearance: 21.9 mL/min (A) (by C-G formula based on SCr of 3.14 mg/dL (H)).    No Known Allergies  Antimicrobials this admission:  8/18 CTX x1 at [facility] 8/18 vanc>> 8/23 8/18 cefepime>> 8/23 8/23 CTX>> 8/26 8/26 vanc>>8/28 8/26 meropenem>> Dose adjustments this admission:  8/21 Vanc 1250mg  q24h, AUC 491, SCr 0.8, Vd 0.5, SCr incr to 0.94, adj Vanc to 1gm q24 Microbiology results:  8/18 BCx: ng-final 8/19 UCx: >100K Strep viridans F 8/18 Trach asp: rare GPC -normal flora- final 8/18 MRSA PCR: neg 8/23 BCx2: ngtd 8/26 BCx2: ngtd 8/26 TA: few candida albicans Final 8/26 UCs: NGF 8/27 MRSA PCR: neg  Thank you for allowing pharmacy to be a part of this patient's care.  Eudelia Bunch, Pharm.D [phone] 03/22/2019 10:43 AM

## 2019-03-22 NOTE — Progress Notes (Signed)
Pt continued to be agitated even after additional sedation.  Pp high 50's on ventilator, stacking respirations, pt suctioned again and it appears patient had a mucus plug causing high peak pressures and increased agitation.  Peak pressures immediately improved to 20, pt became visibly more calm and restful.  HR mid 60s, precedex titrated down.  Will monitor closely

## 2019-03-23 DIAGNOSIS — J189 Pneumonia, unspecified organism: Secondary | ICD-10-CM

## 2019-03-23 LAB — GLUCOSE, CAPILLARY
Glucose-Capillary: 128 mg/dL — ABNORMAL HIGH (ref 70–99)
Glucose-Capillary: 136 mg/dL — ABNORMAL HIGH (ref 70–99)
Glucose-Capillary: 125 mg/dL — ABNORMAL HIGH (ref 70–99)
Glucose-Capillary: 138 mg/dL — ABNORMAL HIGH (ref 70–99)
Glucose-Capillary: 139 mg/dL — ABNORMAL HIGH (ref 70–99)
Glucose-Capillary: 150 mg/dL — ABNORMAL HIGH (ref 70–99)

## 2019-03-23 LAB — BASIC METABOLIC PANEL
Anion gap: 9 (ref 5–15)
BUN: 133 mg/dL — ABNORMAL HIGH (ref 8–23)
CO2: 22 mmol/L (ref 22–32)
Calcium: 7.7 mg/dL — ABNORMAL LOW (ref 8.9–10.3)
Chloride: 117 mmol/L — ABNORMAL HIGH (ref 98–111)
Creatinine, Ser: 3.62 mg/dL — ABNORMAL HIGH (ref 0.44–1.00)
GFR calc Af Amer: 15 mL/min — ABNORMAL LOW (ref >60)
GFR calc non Af Amer: 13 mL/min — ABNORMAL LOW (ref >60)
Glucose, Bld: 138 mg/dL — ABNORMAL HIGH (ref 70–99)
Potassium: 5.1 mmol/L (ref 3.5–5.1)
Sodium: 148 mmol/L — ABNORMAL HIGH (ref 135–145)

## 2019-03-23 LAB — BLOOD GAS, ARTERIAL
Acid-base deficit: 5.7 mmol/L — ABNORMAL HIGH (ref 0.0–2.0)
Acid-base deficit: 4.4 mmol/L — ABNORMAL HIGH (ref 0.0–2.0)
Bicarbonate: 20.5 mmol/L (ref 20.0–28.0)
Bicarbonate: 20.8 mmol/L (ref 20.0–28.0)
Drawn by: 11249
Drawn by: 257881
FIO2: 40
FIO2: 40
MECHVT: 500 mL
MECHVT: 600 mL
O2 Saturation: 98.5 %
O2 Saturation: 96.6 %
PEEP: 10 cmH2O
PEEP: 10 cmH2O
Patient temperature: 98.7
Patient temperature: 98.6
RATE: 14 resp/min
RATE: 10 resp/min
pCO2 arterial: 44.8 mmHg (ref 32.0–48.0)
pCO2 arterial: 41 mmHg (ref 32.0–48.0)
pH, Arterial: 7.283 — ABNORMAL LOW (ref 7.350–7.450)
pH, Arterial: 7.325 — ABNORMAL LOW (ref 7.350–7.450)
pO2, Arterial: 146 mmHg — ABNORMAL HIGH (ref 83.0–108.0)
pO2, Arterial: 95 mmHg (ref 83.0–108.0)

## 2019-03-23 LAB — CBC WITH DIFFERENTIAL/PLATELET
Abs Immature Granulocytes: 0.13 10*3/uL — ABNORMAL HIGH (ref 0.00–0.07)
Basophils Absolute: 0 10*3/uL (ref 0.0–0.1)
Basophils Relative: 0 %
Eosinophils Absolute: 0.2 10*3/uL (ref 0.0–0.5)
Eosinophils Relative: 3 %
HCT: 28.1 % — ABNORMAL LOW (ref 36.0–46.0)
Hemoglobin: 8 g/dL — ABNORMAL LOW (ref 12.0–15.0)
Immature Granulocytes: 1 %
Lymphocytes Relative: 7 %
Lymphs Abs: 0.7 10*3/uL (ref 0.7–4.0)
MCH: 31.1 pg (ref 26.0–34.0)
MCHC: 28.5 g/dL — ABNORMAL LOW (ref 30.0–36.0)
MCV: 109.3 fL — ABNORMAL HIGH (ref 80.0–100.0)
Monocytes Absolute: 1.1 10*3/uL — ABNORMAL HIGH (ref 0.1–1.0)
Monocytes Relative: 11 %
Neutro Abs: 7.4 10*3/uL (ref 1.7–7.7)
Neutrophils Relative %: 78 %
Platelets: 71 10*3/uL — ABNORMAL LOW (ref 150–400)
RBC: 2.57 MIL/uL — ABNORMAL LOW (ref 3.87–5.11)
RDW: 16.1 % — ABNORMAL HIGH (ref 11.5–15.5)
WBC: 9.6 10*3/uL (ref 4.0–10.5)
nRBC: 0 % (ref 0.0–0.2)

## 2019-03-23 LAB — SODIUM, URINE, RANDOM: Sodium, Ur: 15 mmol/L

## 2019-03-23 LAB — MAGNESIUM: Magnesium: 2.5 mg/dL — ABNORMAL HIGH (ref 1.7–2.4)

## 2019-03-23 MED ORDER — STERILE WATER FOR INJECTION IJ SOLN: 30.0000 mg/kg | Freq: Two times a day (BID) | INTRAMUSCULAR | Status: DC

## 2019-03-23 MED ORDER — LACTULOSE 10 GM/15ML PO SOLN
30.0000 g | Freq: Two times a day (BID) | ORAL | Status: DC
  Administered 2019-03-23 – 2019-03-25 (×5): 30 g
  Filled 2019-03-23 (×5): qty 45

## 2019-03-23 MED ORDER — HALOPERIDOL LACTATE 5 MG/ML IJ SOLN
5.0000 mg | INTRAMUSCULAR | Status: DC | PRN
  Administered 2019-03-23 – 2019-03-25 (×7): 5 mg via INTRAVENOUS
  Filled 2019-03-23 (×7): qty 1

## 2019-03-23 MED ORDER — SODIUM CHLORIDE 0.9 % IV SOLN
2.0000 g | Freq: Two times a day (BID) | INTRAVENOUS | Status: DC
  Administered 2019-03-23 – 2019-03-24 (×2): 2 g via INTRAVENOUS
  Filled 2019-03-23 (×3): qty 2

## 2019-03-23 MED ORDER — DEXTROSE-NACL 2.5-0.45 % IV SOLN: INTRAVENOUS | Status: DC

## 2019-03-23 MED ORDER — DEXTROSE-NACL 5-0.45 % IV SOLN
INTRAVENOUS | Status: DC
  Administered 2019-03-23 – 2019-03-26 (×7): via INTRAVENOUS

## 2019-03-23 MED ORDER — BISACODYL 10 MG RE SUPP: 10.0000 mg | Freq: Every day | RECTAL | Status: DC | PRN

## 2019-03-23 NOTE — Progress Notes (Signed)
Elink notified of ABG results.  No further orders at this time

## 2019-03-23 NOTE — Progress Notes (Signed)
Pharmacy Antibiotic Note  Stephanie Chen is a 63 y.o. female admitted on 03/10/2019 with sepsis.  Pharmacy has been consulted for meropenem dosing. 03/23/2019 Day #12 full abx To transition meropenem>>cefepime today for HCAP AF, WBC down to WNL SCr rising daily, 3.62 today No significant culture data  Plan: Cefepime 2 gm IV q12  Height: 5\' 4"  (162.6 cm) Weight: 243 lb 13.3 oz (110.6 kg) IBW/kg (Calculated) : 54.7  Temp (24hrs), Avg:98.6 F (37 C), Min:97.8 F (36.6 C), Max:99.3 F (37.4 C)  Recent Labs  Lab 03/19/19 0500 03/20/19 0353 03/21/19 0412 03/22/19 0447 03/23/19 0425 03/23/19 0515  WBC 15.3* 15.3* 15.1* 10.6* 9.6  --   CREATININE 1.68* 2.25* 2.88* 3.14*  --  3.62*    Estimated Creatinine Clearance: 19.1 mL/min (A) (by C-G formula based on SCr of 3.62 mg/dL (H)).    No Known Allergies  Antimicrobials this admission:  8/18 CTX x1 at [facility] 8/18 vanc>> 8/23 8/18 cefepime>> 8/23  8/30 8/23 CTX>> 8/26 8/26 vanc>>8/28 8/26 meropenem>>8/30 Dose adjustments this admission:  8/21 Vanc 1250mg  q24h, AUC 491, SCr 0.8, Vd 0.5, SCr incr to 0.94, adj Vanc to 1gm q24 Microbiology results:  8/18 BCx: ng-final 8/19 UCx: >100K Strep viridans F 8/18 Trach asp: rare GPC -normal flora- final 8/18 MRSA PCR: neg 8/23 BCx2: ngF 8/26 BCx2: ngtd 8/26 TA: few candida albicans Final 8/26 UCx: NGF 8/27 MRSA PCR: neg  Thank you for allowing pharmacy to be a part of this patient's care.  Eudelia Bunch, Pharm.D [phone] 03/23/2019 10:02 AM

## 2019-03-23 NOTE — Progress Notes (Addendum)
NAME:  Stephanie SkeenSusan M Chen, MRN:  [id number], DOB:  06/15/1956, LOS: 12 ADMISSION DATE:  03/03/2019, CONSULTATION DATE:  03/24/2019 REFERRING MD:  OSH, CHIEF COMPLAINT:  ETOH withdrawal  Brief History   63 yowf with acute etoh withdrawal and possible sepsis  pmh copd, and etoh abuse who presented with ams to osh. Per limited records sent (pt unresponsive so all history is obtained from chart review). Pt  confused x 2 weeks PTA.  Reportedly seen by her pcp for the same confusion but it is unclear what occurred after that visit. Daughter had reported pt was having hallucinations, multiple falls and increased weakness. Over the past 7 days PTA  ? utilizing her inhalers more frequently, although husband denied this.   Upon presentation to osh she was tremulous and concern over DTs with acute etoh withdraw was raised. She was given multiple doses of ativan/valium and subsequently started on precedex. Husband via the phone stated  that she "may drink 2-3 beers a day" last drink was one day PTA   possibly (husband  unsure as he was at work but  sure she did not drink anything on the pm PTA);  however, she was restless and "so confused" . AMS had been progressively worse x 24 h  PTA  and not sleeping well x 2 days. Denieds ick contacts, fevers/chills/cough - had been taking her regularly prescribed medications.   Upon arrival to our ICU  poorly responsive, not protecting airway and abdominally breathing  febrile but hemodynamically stable, intubated.     Past Medical History   Past Medical History:  Diagnosis Date  . Arthritis   . Asthma   . COPD (chronic obstructive pulmonary disease) (HCC)   . Fibromyalgia   . Pneumonia   . Shortness of breath dyspnea     Significant Hospital Events   8/18: [facility], stable 8/23 remained  encephalopathic 8/24 still with no purposeful interaction  Consults:  PCCM Neurology signed off 8/26, phone  reconsult 8/28 re persistent AMS: dx  TME / chronic drugs need to all was out  Procedures:  Oral ET  8/18 >>>  Significant Diagnostic Tests:  8/18 CT head IMPRESSION: No acute intracranial abnormality. 8/18  Echo >>>   Mitral regurgitation with Mod LAE 8/21 MRI IMPRESSION: 1. No acute finding including evidence of posterior reversible encephalopathy syndrome. 2. Moderate chronic small vessel ischemia. EEG 8/24>>>   No sz    Micro Data:  8/18 sars2: neg 8/18: blood cx: Neg  8/18 urine cx: Streptococcus viridans 8/18 resp cx: Normal flora 03/16/2019: Blood culture x 2 neg 03/19/2019 blood cultures x 2>> 03/19/2019 urine culture>> negative 03/19/2019 sputum culture>> mod wbcs no org seen >>> few candida  03/20/2019  MRSA screen >  neg   Antimicrobials:  Cefepime 8/18-8/23 vanc 8/18-8/22 Rocephin 8/23>> 03/19/2019 03/19/2019 vancomycin only one dose  03/19/2019 meropenem>> 8/30 Maxepime 8/30 >>>   Interim history/subjective:   still having trouble with agitation when not sedated with precedex and hbp rx clonidine with variable bp and pulse issues from the combo   Objective   Blood pressure (!) 167/92, pulse 83, temperature 98.7 F (37.1 C), temperature source Oral, resp. rate (!) 23, height 5\' 4"  (1.626 m), weight 110.6 kg, SpO2 98 %.    Vent Mode: PRVC FiO2 (%):  [40 %] 40 % Set Rate:  [10 bmp-14 bmp] 10 bmp Vt Set:  [500 mL] 500 mL PEEP:  [5 cmH20-10 cmH20] 5 cmH20 Pressure Support:  [  Gravette Pressure:  [15 cmH20-22 cmH20] 22 cmH20   Intake/Output Summary (Last 24 hours) at 03/23/2019 0935 Last data filed at 03/23/2019 0921 Gross per 24 hour  Intake 4972.55 ml  Output 1565 ml  Net 3407.55 ml   Filed Weights   03/21/19 0449 03/22/19 0500 03/23/19 0421  Weight: 103.6 kg 109.4 kg 110.6 kg     Examination:    Pt moving all 4, intermittently agitated, much better p haldol/fentanyl IV  No jvd Orophx et in place Neck supple  Lung with minimal insp/exp rhonchi  RRR no s3  abd min  distended, decreased bs/ no BM on lactulose Ext warm/ pas, no edema.         Resolved Hospital Problem list     Assessment & Plan:   Encephalopathy 8/21 MRI 1. No acute finding including evidence of posterior reversible encephalopathy syndrome. 2. Moderate chronic small vessel ischemia. -EEG 8/24 No sz  -neuro phone consult 8/28 ok with continue to  wean off ativan/ rx precedex /clonidine Plan: Stopped  all scheduled narcotics 8/28 and placed back on precedex/ clonidine but bp did not tolerate (see hypertension) as on norvasc/lopressor > d/c'd 8/29 and still some problems overnight but best bet for weaning and extubation is precedex/ clonidine so will resume am 8/30 and just dose fent/haldol prn to supplement the precedex drip.  Consider re-neuro eval 8/31      Acute respiratory failure/vent dep Plan: Vent bundle Altered mental status precludes extubation, probably needs trach next week as now day #12 if not progressing early this week with sedation med changes       Hypertension Plan: D/c'd  norvasc and metaprolol 8/29 so we can use max precedex/clonidine     Fever/ probable HCAP  Strep viridans in urine Probable HCAP Plan: Started on vancomycin and meropenem on 03/19/2019 and defervesced > changed to maxepime 9/30 for HCAP  No positive culture data other than viridans as per micro flow sheet above    Transaminitis with hyperbilirubinemia Resolved  History of EtOH use Stopped all narcotics except for PRN fentanyl on 8/28  Continue multivitamins On maint Ativan but weaned to 0.5 q 6h as of am 8/28    Chronic pain/fibromyalgia/depression Patient was on high doses of oxycodone at home Stimulating antidepressants initiated-was on Cymbalta 60 at home Plan  Neurology signed off 8/27 03/21/2019   stopped all scheduled narcotics Leave PRN fentanyl Using clonidine/ precedex for w/d potential     Renal insufficiency Lab Results  Component Value Date    CREATININE 3.62 (H) 03/23/2019   CREATININE 3.14 (H) 03/22/2019   CREATININE 2.88 (H) 03/21/2019   Recent Labs  Lab 03/21/19 0412 03/22/19 0447 03/23/19 0515  K 4.7 4.8 5.1   Plan: Continue to hold Lasix   Continue to monitor BUN and creatinine Placed foley back in for closer monitoring am 8/29 > oliguric since at only 275 cc x 24 h  Check urine lytes am 8/30    Mitral regurgitation with Mod LAE - see echo  03/12/19   Thrombocytopenia ? Chronic  - not present on admit/ ? From sepsis > monitor for now on sq hep  Best practice:  Diet: Vital at goal Pain/Anxiety/Delirium protocol (if indicated):  Pecedex/ low dose ative/ prn fent VAP protocol (if indicated): Per protocol DVT prophylaxis: Heparin sq GI prophylaxis: h2 Glucose control: n/a Mobility: bedrest Code Status: Full code  Family Communication: 03/21/2019  Discussed at length with husband Disposition: ICU  Labs  CBC: Recent Labs  Lab 03/19/19 0500 03/20/19 0353 03/21/19 0412 03/22/19 0447 03/23/19 0425  WBC 15.3* 15.3* 15.1* 10.6* 9.6  NEUTROABS 12.4* 11.9* 11.9* 8.4* 7.4  HGB 10.7* 10.0* 10.2* 8.8* 8.0*  HCT 36.2 34.2* 35.1* 30.1* 28.1*  MCV 105.8* 105.2* 106.0* 107.1* 109.3*  PLT 124* 127* 113* 91* 71*    Basic Metabolic Panel: Recent Labs  Lab 03/19/19 0500 03/20/19 0353 03/21/19 0412 03/22/19 0447 03/23/19 0515  NA 143 142 146* 147* 148*  K 4.2 4.1 4.7 4.8 5.1  CL 112* 111 115* 117* 117*  CO2 23 20* 23 20* 22  GLUCOSE 131* 130* 129* 113* 138*  BUN 74* 90* 112* 123* 133*  CREATININE 1.68* 2.25* 2.88* 3.14* 3.62*  CALCIUM 7.6* 7.3* 7.8* 7.2* 7.7*  MG 2.5* 2.4 2.6* 2.3 2.5*  PHOS 3.4 3.7 5.6* 5.7*  --    GFR: Estimated Creatinine Clearance: 19.1 mL/min (A) (by C-G formula based on SCr of 3.62 mg/dL (H)). Recent Labs  Lab 03/20/19 0353 03/21/19 0412 03/22/19 0447 03/23/19 0425  WBC 15.3* 15.1* 10.6* 9.6    Liver Function Tests: Recent Labs  Lab 03/17/19 0421 03/20/19 0353  AST  36 28  ALT 46* 36  ALKPHOS 61 81  BILITOT 1.0 0.7  PROT 6.2* 6.3*  ALBUMIN 2.5* 2.5*   No results for input(s): LIPASE, AMYLASE in the last 168 hours. Recent Labs  Lab 03/17/19 0926 03/19/19 1050 03/21/19 1017  AMMONIA 42* 50* 64*    ABG    Component Value Date/Time   PHART 7.283 (L) 03/23/2019 0311   PCO2ART 44.8 03/23/2019 0311   PO2ART 146 (H) 03/23/2019 0311   HCO3 20.5 03/23/2019 0311   ACIDBASEDEF 5.7 (H) 03/23/2019 0311   O2SAT 98.5 03/23/2019 0311     Coagulation Profile: Recent Labs  Lab 03/17/19 1103  INR 1.1    Cardiac Enzymes: No results for input(s): CKTOTAL, CKMB, CKMBINDEX, TROPONINI in the last 168 hours.  HbA1C: No results found for: HGBA1C  CBG: Recent Labs  Lab 03/22/19 1606 03/22/19 1941 03/22/19 2318 03/23/19 0311 03/23/19 0734  GLUCAP 132* 128* 136* 128* 136*       The patient is critically ill with multiple organ systems failure and requires high complexity decision making for assessment and support, frequent evaluation and titration of therapies, application of advanced monitoring technologies and extensive interpretation of multiple databases. Critical Care Time devoted to patient care services described in this note is 40 minutes.    Sandrea Hughs, MD Pulmonary and Critical Care Medicine [facility] Cell [phone] After 5:30 PM or weekends, use Beeper [phone]

## 2019-03-23 NOTE — Progress Notes (Signed)
Pt BP mid 80s.  Neosynephrine started at low dose.  Titrated per parameters.

## 2019-03-23 NOTE — Progress Notes (Signed)
eLink Physician-Brief Progress Note Patient Name: Stephanie Chen DOB: April 27, 1956 MRN: [id number]   Date of Service  03/23/2019  HPI/Events of Note  Request for AM labs orders.  eICU Interventions  Will order CBC with platelets, BMP and Mg++ level in AM.     Intervention Category Major Interventions: Other:  Doak Mah Cornelia Copa 03/23/2019, 3:21 AM

## 2019-03-23 NOTE — Progress Notes (Signed)
RT attempted to wean the Pt and her RR increased and the Pt was agitated. RT Placed the Pt back on full support

## 2019-03-24 ENCOUNTER — Inpatient Hospital Stay (HOSPITAL_COMMUNITY): Payer: Commercial Managed Care - PPO

## 2019-03-24 LAB — CULTURE, BLOOD (ROUTINE X 2)
Culture: NO GROWTH
Culture: NO GROWTH
Special Requests: ADEQUATE
Special Requests: ADEQUATE

## 2019-03-24 LAB — GLUCOSE, CAPILLARY
Glucose-Capillary: 130 mg/dL — ABNORMAL HIGH (ref 70–99)
Glucose-Capillary: 139 mg/dL — ABNORMAL HIGH (ref 70–99)
Glucose-Capillary: 152 mg/dL — ABNORMAL HIGH (ref 70–99)
Glucose-Capillary: 152 mg/dL — ABNORMAL HIGH (ref 70–99)
Glucose-Capillary: 142 mg/dL — ABNORMAL HIGH (ref 70–99)
Glucose-Capillary: 147 mg/dL — ABNORMAL HIGH (ref 70–99)

## 2019-03-24 LAB — BASIC METABOLIC PANEL
Anion gap: 14 (ref 5–15)
BUN: 142 mg/dL — ABNORMAL HIGH (ref 8–23)
CO2: 18 mmol/L — ABNORMAL LOW (ref 22–32)
Calcium: 8 mg/dL — ABNORMAL LOW (ref 8.9–10.3)
Chloride: 115 mmol/L — ABNORMAL HIGH (ref 98–111)
Creatinine, Ser: 3.82 mg/dL — ABNORMAL HIGH (ref 0.44–1.00)
GFR calc Af Amer: 14 mL/min — ABNORMAL LOW (ref >60)
GFR calc non Af Amer: 12 mL/min — ABNORMAL LOW (ref >60)
Glucose, Bld: 135 mg/dL — ABNORMAL HIGH (ref 70–99)
Potassium: 4.8 mmol/L (ref 3.5–5.1)
Sodium: 147 mmol/L — ABNORMAL HIGH (ref 135–145)

## 2019-03-24 LAB — CBC
HCT: 34.7 % — ABNORMAL LOW (ref 36.0–46.0)
Hemoglobin: 10.6 g/dL — ABNORMAL LOW (ref 12.0–15.0)
MCH: 31.2 pg (ref 26.0–34.0)
MCHC: 30.5 g/dL (ref 30.0–36.0)
MCV: 102.1 fL — ABNORMAL HIGH (ref 80.0–100.0)
Platelets: 109 10*3/uL — ABNORMAL LOW (ref 150–400)
RBC: 3.4 MIL/uL — ABNORMAL LOW (ref 3.87–5.11)
RDW: 16.1 % — ABNORMAL HIGH (ref 11.5–15.5)
WBC: 15.6 10*3/uL — ABNORMAL HIGH (ref 4.0–10.5)
nRBC: 0 % (ref 0.0–0.2)

## 2019-03-24 MED ORDER — METOPROLOL TARTRATE 5 MG/5ML IV SOLN
2.5000 mg | INTRAVENOUS | Status: DC | PRN
  Administered 2019-03-24 (×2): 5 mg via INTRAVENOUS
  Filled 2019-03-24 (×2): qty 5

## 2019-03-24 MED ORDER — CHLORHEXIDINE GLUCONATE 0.12% ORAL RINSE (MEDLINE KIT)
15.0000 mL | Freq: Two times a day (BID) | OROMUCOSAL | Status: DC
  Administered 2019-03-24 – 2019-03-26 (×6): 15 mL via OROMUCOSAL

## 2019-03-24 MED ORDER — SODIUM CHLORIDE 0.9 % IV SOLN
2.0000 g | INTRAVENOUS | Status: DC
  Administered 2019-03-25: 10:00:00 2 g via INTRAVENOUS
  Filled 2019-03-24: qty 2

## 2019-03-24 MED ORDER — ORAL CARE MOUTH RINSE
15.0000 mL | OROMUCOSAL | Status: DC
  Administered 2019-03-24 – 2019-03-26 (×28): 15 mL via OROMUCOSAL

## 2019-03-24 MED ORDER — FENTANYL CITRATE (PF) 100 MCG/2ML IJ SOLN
100.0000 ug | Freq: Once | INTRAMUSCULAR | Status: AC
  Administered 2019-03-24: 100 ug via INTRAVENOUS
  Filled 2019-03-24: qty 2

## 2019-03-24 NOTE — Progress Notes (Signed)
DR Nelda Marseille said to place Pt back on 8cc and 5 of PEEP. RT made changes to the vent per Dr. Nelda Marseille and Dr. Onalee Hua asked for RT to change the vent order.

## 2019-03-24 NOTE — Progress Notes (Signed)
At approx 0140, high minute ventilation alarm went off on the vent, in to assess patient, ETT intact positioned not changed, Lungs diminished throughout (not an acute change), VS and Saturations WNL, however RR 34-36 when it had been in the lower 20s for most of the night.  Earlier at 47, HR was in the 60s, so I decreased the Precedex from 1.5 to 1, so thought the increase in respirations was a sedation issue, plus patient moving her head back and forth more aggressively and appears restless.  I gave Fentanyl 87mcg per orders, with no change in condition.  Respiratory Therapy in room and assessed, lavaged and suctioned airway and gave breathing treatment with no change.  E-link notified.  Briefly discussed situation with E-link RN and she advised would have Dr. Oletta Darter call me back.  Jacqulyn Ducking ICU/SD RN4 / Care Coordinator / Rapid Response Nurse Rapid Response Number:  [phone] ICU Charge Nurse Number:  [phone]

## 2019-03-24 NOTE — Progress Notes (Signed)
Pharmacy Antibiotic Note  Stephanie Chen is a 63 y.o. female admitted on 03-17-2019 with sepsis.  Pharmacy was consulted for meropenem dosing.   Day 13 full abx, transitioned meropenem>>cefepime 8/31 for HCAP  SCr rising daily, 3.82 today No significant culture data  Plan: Cefepime 2 gm IV q24  Height: 5\' 4"  (162.6 cm) Weight: 256 lb 6.3 oz (116.3 kg) IBW/kg (Calculated) : 54.7  Temp (24hrs), Avg:99.7 F (37.6 C), Min:98 F (36.7 C), Max:101.7 F (38.7 C)  Recent Labs  Lab 03/20/19 0353 03/21/19 0412 03/22/19 0447 03/23/19 0425 03/23/19 0515 03/24/19 0310  WBC 15.3* 15.1* 10.6* 9.6  --  15.6*  CREATININE 2.25* 2.88* 3.14*  --  3.62* 3.82*    Estimated Creatinine Clearance: 18.6 mL/min (A) (by C-G formula based on SCr of 3.82 mg/dL (H)).    No Known Allergies   Antimicrobials this admission:  8/18 CTX x1 at [facility] 8/18 vanc>> 8/23 8/18 cefepime>> 8/23,  8/30 8/23 CTX>> 8/26 8/26 vanc>>8/28 8/26 meropenem>>8/30 Dose adjustments this admission:   8/21 Vanc 1250mg  q24h, AUC 491, SCr 0.8, Vd 0.5,  SCr incr to 0.94, adj Vanc to 1gm q24 8/31 Cl ~ 19 ml/min, Adj Cefepime to 2gm q24  Microbiology results:  8/18 BCx: ng-final 8/19 UCx: >100K Strep viridans F 8/18 Trach asp: rare GPC -normal flora- final 8/18 MRSA PCR: neg 8/23 BCx2: ngF 8/26 BCx2: ngtd 8/26 TA: few candida albicans Final 8/26 UCx: NGF 8/27 MRSA PCR: neg  Thank you for allowing pharmacy to be a part of this patient's care.  Minda Ditto PharmD Pager [phone] 03/24/2019, 12:10 PM

## 2019-03-24 NOTE — Progress Notes (Signed)
Pt experiencing of new onset of a moderate amount of foamy saliva coming from mouth. MD made aware. Will continue to suction with [facility].

## 2019-03-24 NOTE — Progress Notes (Signed)
NAME:  Stephanie Chen, MRN:  [id number], DOB:  [birth year], LOS: 82 ADMISSION DATE:  2019/03/30, CONSULTATION DATE:  03-30-2019 REFERRING MD:  [facility], CHIEF COMPLAINT:  ETOH withdrawal  Brief History   36 yowf with acute etoh withdrawal and possible sepsis  pmh copd, and etoh abuse who presented with ams to osh. Per limited records sent (pt unresponsive so all history is obtained from chart review). Pt  confused x 2 weeks PTA.  Reportedly seen by her pcp for the same confusion but it is unclear what occurred after that visit. Daughter had reported pt was having hallucinations, multiple falls and increased weakness. Over the past 7 days PTA  ? utilizing her inhalers more frequently, although husband denied this.   Upon presentation to osh she was tremulous and concern over DTs with acute etoh withdraw was raised. She was given multiple doses of ativan/valium and subsequently started on precedex. Husband via the phone stated  that she "may drink 2-3 beers a day" last drink was one day PTA   possibly (husband  unsure as he was at work but  sure she did not drink anything on the pm PTA);  however, she was restless and "so confused" . AMS had been progressively worse x 24 h  PTA  and not sleeping well x 2 days. Denieds ick contacts, fevers/chills/cough - had been taking her regularly prescribed medications.   Upon arrival to our ICU  poorly responsive, not protecting airway and abdominally breathing  febrile but hemodynamically stable, intubated.  Past Medical History   Past Medical History:  Diagnosis Date  . Arthritis   . Asthma   . COPD (chronic obstructive pulmonary disease) ([place])   . Fibromyalgia   . Pneumonia   . Shortness of breath dyspnea    Significant Hospital Events   8/18: transferred from OSH to [facility] On vent, stable 8/23 remained  encephalopathic 8/24 still with no purposeful interaction  Consults:  PCCM Neurology signed off 8/26, phone  reconsult 8/28 re persistent AMS: dx TME /  chronic drugs need  Procedures:  Oral ET  8/18 >>>  Significant Diagnostic Tests:  8/18 CT head IMPRESSION: No acute intracranial abnormality. 8/18  Echo >>>   Mitral regurgitation with Mod LAE 8/21 MRI IMPRESSION: 1. No acute finding including evidence of posterior reversible encephalopathy syndrome. 2. Moderate chronic small vessel ischemia. EEG 8/24>>>   No sz    Micro Data:  8/18 sars2: neg 8/18: blood cx: Neg  8/18 urine cx: Streptococcus viridans 8/18 resp cx: Normal flora 03/16/2019: Blood culture x 2 neg 03/19/2019 blood cultures x 2>> 03/19/2019 urine culture>> negative 03/19/2019 sputum culture>> mod wbcs no org seen >>> few candida  03/20/2019  MRSA screen >  neg   Antimicrobials:  Cefepime 8/18-8/23 vanc 8/18-8/22 Rocephin 8/23>> 03/19/2019 03/19/2019 vancomycin only one dose  03/19/2019 meropenem>> 8/30 Maxepime 8/30 >>>   Interim history/subjective:  No events overnight, no new complaints  Objective   Blood pressure (!) 216/108, pulse (!) 105, temperature (!) 100.4 F (38 C), temperature source Axillary, resp. rate (!) 28, height 5\' 4"  (1.626 m), weight 116.3 kg, SpO2 97 %.    Vent Mode: PRVC FiO2 (%):  [40 %] 40 % Set Rate:  [10 bmp] 10 bmp Vt Set:  [600 mL] 600 mL PEEP:  [10 cmH20] 10 cmH20 Plateau Pressure:  [24 cmH20-45 cmH20] 45 cmH20   Intake/Output Summary (Last 24 hours) at 03/24/2019 1048 Last data filed at 03/24/2019 1023 Gross per 24 hour  Intake 5389.04 ml  Output 1375 ml  Net 4014.04 ml   Filed Weights   03/22/19 0500 03/23/19 0421 03/24/19 0313  Weight: 109.4 kg 110.6 kg 116.3 kg   Examination:  General: Acute on chronically ill appearing female, NAD HEENT: [place]/AT, PERRL, EOM-I and MMM Neuro: Unresponsive but moving all ext to pain Heart: RRR, Nl S1/S2 and -M/R/G Lung: CTA bilaterally Abdomen: Soft, NT, ND and +BS Skin: Intact   I reviewed CXR myself, ETT is in a good position   Resolved Hospital Problem list     Assessment  & Plan:   Encephalopathy 8/21 MRI 1. No acute finding including evidence of posterior reversible encephalopathy syndrome. 2. Moderate chronic small vessel ischemia. -EEG 8/24 No sz  -neuro phone consult 8/28 ok with continue to  wean off ativan/ rx precedex /clonidine Plan: - Minimize sedation as able - If no response to decreasing sedation by AM may need to consider MRI of the brain and reconsult of neurology with an EEG as well - Monitor closely  Acute respiratory failure/vent dep Plan: - Maintain on full vent support - Decrease PEEP to 5 - Wean as able - Titrate O2 for sat of 88-92%  Hypertension Plan: D/c'd  norvasc and metaprolol 8/29 so we can use max precedex/clonidine  Tele monitoring  Fever/ probable HCAP  Strep viridans in urine Probable HCAP Plan: - Maintain on abx - F/u on cultures  Transaminitis with hyperbilirubinemia - Resolved - Labs ordered for AM  History of EtOH use - Stopped all narcotics except for PRN fentanyl on 8/28  - Continue multivitamins - Precedex - On maint Ativan but weaned to 0.5 q 6h as of am 8/28  Chronic pain/fibromyalgia/depression Patient was on high doses of oxycodone at home Stimulating antidepressants initiated-was on Cymbalta 60 at home Plan  Neurology signed off 8/27 03/21/2019   stopped all scheduled narcotics Leave PRN fentanyl Using clonidine/ precedex for w/d potential   Renal insufficiency Lab Results  Component Value Date   CREATININE 3.82 (H) 03/24/2019   CREATININE 3.62 (H) 03/23/2019   CREATININE 3.14 (H) 03/22/2019   Recent Labs  Lab 03/22/19 0447 03/23/19 0515 03/24/19 0310  K 4.8 5.1 4.8   Plan: - Continue to hold Lasix   - Continue to monitor BUN and creatinine - BMET in AM - Replace electrolytes as indicated    Mitral regurgitation with Mod LAE - See echo  03/12/19   Thrombocytopenia ? Chronic  - Pot present on admit/ ? From sepsis > monitor for now on sq hep  Best practice:  Diet:  Vital at goal Pain/Anxiety/Delirium protocol (if indicated):  Pecedex/ low dose ative/ prn fent VAP protocol (if indicated): Per protocol DVT prophylaxis: Heparin sq GI prophylaxis: h2 Glucose control: n/a Mobility: bedrest Code Status: Full code  Family Communication: 03/21/2019  Discussed at length with husband Disposition: ICU  Labs   CBC: Recent Labs  Lab 03/19/19 0500 03/20/19 0353 03/21/19 0412 03/22/19 0447 03/23/19 0425 03/24/19 0310  WBC 15.3* 15.3* 15.1* 10.6* 9.6 15.6*  NEUTROABS 12.4* 11.9* 11.9* 8.4* 7.4  --   HGB 10.7* 10.0* 10.2* 8.8* 8.0* 10.6*  HCT 36.2 34.2* 35.1* 30.1* 28.1* 34.7*  MCV 105.8* 105.2* 106.0* 107.1* 109.3* 102.1*  PLT 124* 127* 113* 91* 71* 109*    Basic Metabolic Panel: Recent Labs  Lab 03/19/19 0500 03/20/19 0353 03/21/19 0412 03/22/19 0447 03/23/19 0515 03/24/19 0310  NA 143 142 146* 147* 148* 147*  K 4.2 4.1 4.7 4.8 5.1 4.8  CL 112* 111 115* 117* 117* 115*  CO2 23 20* 23 20* 22 18*  GLUCOSE 131* 130* 129* 113* 138* 135*  BUN 74* 90* 112* 123* 133* 142*  CREATININE 1.68* 2.25* 2.88* 3.14* 3.62* 3.82*  CALCIUM 7.6* 7.3* 7.8* 7.2* 7.7* 8.0*  MG 2.5* 2.4 2.6* 2.3 2.5*  --   PHOS 3.4 3.7 5.6* 5.7*  --   --    GFR: Estimated Creatinine Clearance: 18.6 mL/min (A) (by C-G formula based on SCr of 3.82 mg/dL (H)). Recent Labs  Lab 03/21/19 0412 03/22/19 0447 03/23/19 0425 03/24/19 0310  WBC 15.1* 10.6* 9.6 15.6*    Liver Function Tests: Recent Labs  Lab 03/20/19 0353  AST 28  ALT 36  ALKPHOS 81  BILITOT 0.7  PROT 6.3*  ALBUMIN 2.5*   No results for input(s): LIPASE, AMYLASE in the last 168 hours. Recent Labs  Lab 03/19/19 1050 03/21/19 1017  AMMONIA 50* 64*    ABG    Component Value Date/Time   PHART 7.325 (L) 03/23/2019 1155   PCO2ART 41.0 03/23/2019 1155   PO2ART 95.0 03/23/2019 1155   HCO3 20.8 03/23/2019 1155   ACIDBASEDEF 4.4 (H) 03/23/2019 1155   O2SAT 96.6 03/23/2019 1155     Coagulation  Profile: Recent Labs  Lab 03/17/19 1103  INR 1.1    Cardiac Enzymes: No results for input(s): CKTOTAL, CKMB, CKMBINDEX, TROPONINI in the last 168 hours.  HbA1C: No results found for: HGBA1C  CBG: Recent Labs  Lab 03/23/19 1613 03/23/19 1933 03/23/19 2317 03/24/19 0314 03/24/19 0747  GLUCAP 138* 139* 150* 130* 139*   The patient is critically ill with multiple organ systems failure and requires high complexity decision making for assessment and support, frequent evaluation and titration of therapies, application of advanced monitoring technologies and extensive interpretation of multiple databases.   Critical Care Time devoted to patient care services described in this note is  32  Minutes. This time reflects time of care of this signee Dr Koren BoundWesam . This critical care time does not reflect procedure time, or teaching time or supervisory time of PA/NP/Med student/Med Resident etc but could involve care discussion time.  Alyson ReedyWesam G. , M.D. [facility]. Pager: [phone]. After hours pager: [phone].

## 2019-03-24 NOTE — Progress Notes (Signed)
eLink Physician-Brief Progress Note Patient Name: Stephanie Chen DOB: 1955-10-21 MRN: [id number]   Date of Service  03/24/2019  HPI/Events of Note  Agitation - Bedside nurse is titrating the Precedex IV infusion as ordered.   eICU Interventions  Will order: 1. Fentanyl 100 mcg IV X 1 now.      Intervention Category Major Interventions: Delirium, psychosis, severe agitation - evaluation and management  Deetra Booton [place] 03/24/2019, 2:41 AM

## 2019-03-25 ENCOUNTER — Inpatient Hospital Stay (HOSPITAL_COMMUNITY): Payer: Commercial Managed Care - PPO

## 2019-03-25 DIAGNOSIS — N179 Acute kidney failure, unspecified: Secondary | ICD-10-CM

## 2019-03-25 LAB — COMPREHENSIVE METABOLIC PANEL
ALT: 61 U/L — ABNORMAL HIGH (ref 0–44)
AST: 50 U/L — ABNORMAL HIGH (ref 15–41)
Albumin: 1.9 g/dL — ABNORMAL LOW (ref 3.5–5.0)
Alkaline Phosphatase: 93 U/L (ref 38–126)
Anion gap: 13 (ref 5–15)
BUN: 145 mg/dL — ABNORMAL HIGH (ref 8–23)
CO2: 16 mmol/L — ABNORMAL LOW (ref 22–32)
Calcium: 7.1 mg/dL — ABNORMAL LOW (ref 8.9–10.3)
Chloride: 113 mmol/L — ABNORMAL HIGH (ref 98–111)
Creatinine, Ser: 3.78 mg/dL — ABNORMAL HIGH (ref 0.44–1.00)
GFR calc Af Amer: 14 mL/min — ABNORMAL LOW (ref >60)
GFR calc non Af Amer: 12 mL/min — ABNORMAL LOW (ref >60)
Glucose, Bld: 454 mg/dL — ABNORMAL HIGH (ref 70–99)
Potassium: 4.9 mmol/L (ref 3.5–5.1)
Sodium: 142 mmol/L (ref 135–145)
Total Bilirubin: 0.5 mg/dL (ref 0.3–1.2)
Total Protein: 5.1 g/dL — ABNORMAL LOW (ref 6.5–8.1)

## 2019-03-25 LAB — URINALYSIS, ROUTINE W REFLEX MICROSCOPIC
Bilirubin Urine: NEGATIVE
Glucose, UA: NEGATIVE mg/dL
Ketones, ur: NEGATIVE mg/dL
Nitrite: NEGATIVE
Protein, ur: NEGATIVE mg/dL
Specific Gravity, Urine: 1.009 (ref 1.005–1.030)
pH: 5 (ref 5.0–8.0)

## 2019-03-25 LAB — GLUCOSE, CAPILLARY
Glucose-Capillary: 139 mg/dL — ABNORMAL HIGH (ref 70–99)
Glucose-Capillary: 141 mg/dL — ABNORMAL HIGH (ref 70–99)
Glucose-Capillary: 148 mg/dL — ABNORMAL HIGH (ref 70–99)
Glucose-Capillary: 155 mg/dL — ABNORMAL HIGH (ref 70–99)
Glucose-Capillary: 158 mg/dL — ABNORMAL HIGH (ref 70–99)

## 2019-03-25 LAB — CBC
HCT: 29.3 % — ABNORMAL LOW (ref 36.0–46.0)
Hemoglobin: 8.8 g/dL — ABNORMAL LOW (ref 12.0–15.0)
MCH: 31.4 pg (ref 26.0–34.0)
MCHC: 30 g/dL (ref 30.0–36.0)
MCV: 104.6 fL — ABNORMAL HIGH (ref 80.0–100.0)
Platelets: 100 10*3/uL — ABNORMAL LOW (ref 150–400)
RBC: 2.8 MIL/uL — ABNORMAL LOW (ref 3.87–5.11)
RDW: 16.2 % — ABNORMAL HIGH (ref 11.5–15.5)
WBC: 15.9 10*3/uL — ABNORMAL HIGH (ref 4.0–10.5)
nRBC: 0 % (ref 0.0–0.2)

## 2019-03-25 LAB — AMMONIA: Ammonia: 91 umol/L — ABNORMAL HIGH (ref 9–35)

## 2019-03-25 LAB — CREATININE, URINE, RANDOM: Creatinine, Urine: 18.5 mg/dL

## 2019-03-25 LAB — SODIUM, URINE, RANDOM: Sodium, Ur: 94 mmol/L

## 2019-03-25 MED ORDER — LACTULOSE 10 GM/15ML PO SOLN
30.0000 g | Freq: Three times a day (TID) | ORAL | Status: DC
  Administered 2019-03-25 – 2019-03-26 (×4): 30 g
  Filled 2019-03-25 (×4): qty 45

## 2019-03-25 MED ORDER — FENTANYL CITRATE (PF) 100 MCG/2ML IJ SOLN
100.0000 ug | Freq: Once | INTRAMUSCULAR | Status: AC
  Administered 2019-03-25: 100 ug via INTRAVENOUS
  Filled 2019-03-25: qty 2

## 2019-03-25 MED ORDER — METOPROLOL TARTRATE 25 MG/10 ML ORAL SUSPENSION
25.0000 mg | Freq: Two times a day (BID) | ORAL | Status: DC
  Administered 2019-03-25 – 2019-03-26 (×3): 25 mg
  Filled 2019-03-25 (×4): qty 10

## 2019-03-25 MED ORDER — FUROSEMIDE 10 MG/ML IJ SOLN
60.0000 mg | Freq: Once | INTRAMUSCULAR | Status: AC
  Administered 2019-03-25: 12:00:00 60 mg via INTRAVENOUS
  Filled 2019-03-25 (×2): qty 6

## 2019-03-25 MED ORDER — PROPOFOL 1000 MG/100ML IV EMUL: 5.0000 ug/kg/min | INTRAVENOUS | Status: DC

## 2019-03-25 MED ORDER — FUROSEMIDE 10 MG/ML IJ SOLN
100.0000 mg | Freq: Three times a day (TID) | INTRAVENOUS | Status: DC
  Administered 2019-03-25 – 2019-03-26 (×2): 100 mg via INTRAVENOUS
  Filled 2019-03-25 (×4): qty 10

## 2019-03-25 NOTE — Consult Note (Signed)
Renal Service Consult Note [facility]  JAYLYNNE BIRKHEAD 03/25/2019 Sol Blazing Requesting Physician:  Dr Nelda Marseille  Reason for Consult:  AKI in patient in ICU  HPI: The patient is a 63 y.o. year-old w/ hx of DM, COPD, chronic pain on narcotics, COPD, PNA and DJD admitted on 02/25/2019 with AMS. She had been confused for 2 weeks. Per family pt was having probs w/ hallucinations, falls and increased gen weakness. In ED at osh she was felt to have CT's and rec'd multiple BZD doses. +etoh 2-3 beers a day per family member. On arrival to ICU pt was intubated. Pt's renal function was normal on admit but since 8/24 creat / bun have been rising and now BUN is 140 and creat 3.8.  Pt is still confused on the vent and we are asked to see for renal failure.   Creat on admit 8/18 was 0.85 >> has been gradually increasing up to 3.78 today (3.8 yest, 3.6 day before), BUN 145 today (18 admit, 142 yest).   BP's since admit were high the first several days then normal, now normal w/ occassional low BP's.  No shock while here. Did get Cardene gtt early on. Has not received any IV contrast, ACEi/ ARB or nsaids while here.   IV abx as follows:  IV vanc 8/18 - 8/22  IV Cefepime 8/18 - 8/22  IV Rocephin  8/23 - 8/26  IV Meropenem 8/26- current  Multiple IV sedation meds including propofol, versed, fentanyl, precedex, haldol, ativan  A few IV doses of lasix 20 mg since admit  PO cymbalta, wellbutrin, prozac   Only prior admit was in 2015 for lower back surgery   ROS n / a   Past Medical History  Past Medical History:  Diagnosis Date  . Arthritis   . Asthma   . COPD (chronic obstructive pulmonary disease) ([place])   . Fibromyalgia   . Pneumonia   . Shortness of breath dyspnea    Past Surgical History  Past Surgical History:  Procedure Laterality Date  . ABDOMINAL HYSTERECTOMY    . APPENDECTOMY    . carpel tunnel Bilateral   . CHOLECYSTECTOMY     Family History  Family History   Problem Relation Age of Onset  . Hypertension Mother   . Hypertension Father    Social History  reports that she has been smoking cigarettes. She has a 45.00 pack-year smoking history. She has never used smokeless tobacco. She reports current alcohol use of about 3.0 standard drinks of alcohol per week. She reports that she does not use drugs. Allergies No Known Allergies Home medications Prior to Admission medications   Medication Sig Start Date End Date Taking? Authorizing Provider  albuterol (PROVENTIL HFA;VENTOLIN HFA) 108 (90 BASE) MCG/ACT inhaler Inhale 2 puffs into the lungs every 6 (six) hours as needed for wheezing or shortness of breath.    [provider]  albuterol (PROVENTIL) (2.5 MG/3ML) 0.083% nebulizer solution Take 2.5 mg by nebulization every 6 (six) hours as needed for wheezing or shortness of breath.    [provider]  buPROPion (WELLBUTRIN XL) 300 MG 24 hr tablet Take 300 mg by mouth daily.    [provider]  cetirizine (ZYRTEC) 10 MG tablet Take 10 mg by mouth daily as needed for allergies.    [provider]  cyanocobalamin (,VITAMIN B-12,) 1000 MCG/ML injection Inject 1,000 mcg into the muscle every 30 (thirty) days.    [provider]  diazepam (VALIUM)  5 MG tablet Take 5 mg by mouth every 8 (eight) hours as needed for anxiety.    [provider]  DULoxetine (CYMBALTA) 60 MG capsule Take 60 mg by mouth daily.    [provider]  furosemide (LASIX) 20 MG tablet Take 20 mg by mouth daily as needed for fluid or edema.    [provider]  OxyCODONE (OXYCONTIN) 40 mg T12A 12 hr tablet Take 40 mg by mouth every 12 (twelve) hours.    [provider]  Oxycodone HCl 20 MG TABS Take 20 mg by mouth every 6 (six) hours as needed (pain).    [provider]  oxyCODONE-acetaminophen (PERCOCET) 10-325 MG per tablet Take 1 tablet by mouth every 6 (six) hours as needed for pain.    [provider]  pantoprazole (PROTONIX) 40 MG tablet Take 40 mg by mouth daily.    [provider]  pregabalin (LYRICA) 75 MG capsule Take 75 mg by mouth 2 (two) times daily.    [provider]  ranitidine (ZANTAC) 300 MG tablet Take 300 mg by mouth at bedtime.    [provider]  Umeclidinium-Vilanterol (ANORO ELLIPTA IN) Inhale 1 puff into the lungs daily.    [provider]   Liver Function Tests Recent Labs  Lab 03/20/19 0353 03/25/19 0409  AST 28 50*  ALT 36 61*  ALKPHOS 81 93  BILITOT 0.7 0.5  PROT 6.3* 5.1*  ALBUMIN 2.5* 1.9*   No results for input(s): LIPASE, AMYLASE in the last 168 hours. CBC Recent Labs  Lab 03/21/19 0412 03/22/19 0447 03/23/19 0425 03/24/19 0310 03/25/19 0409  WBC 15.1* 10.6* 9.6 15.6* 15.9*  NEUTROABS 11.9* 8.4* 7.4  --   --   HGB 10.2* 8.8* 8.0* 10.6* 8.8*  HCT 35.1* 30.1* 28.1* 34.7* 29.3*  MCV 106.0* 107.1* 109.3* 102.1* 104.6*  PLT 113* 91* 71* 109* 100*   Basic Metabolic Panel Recent Labs  Lab 03/19/19 0500 03/20/19 0353 03/21/19 0412 03/22/19 0447 03/23/19 0515 03/24/19 0310 03/25/19 0409  NA 143 142 146* 147* 148* 147* 142  K 4.2 4.1 4.7 4.8 5.1 4.8 4.9  CL 112* 111 115* 117* 117* 115* 113*  CO2 23 20* 23 20* 22 18* 16*  GLUCOSE 131* 130* 129* 113* 138* 135* 454*  BUN 74* 90* 112* 123* 133* 142* 145*  CREATININE 1.68* 2.25* 2.88* 3.14* 3.62* 3.82* 3.78*  CALCIUM 7.6* 7.3* 7.8* 7.2* 7.7* 8.0* 7.1*  PHOS 3.4 3.7 5.6* 5.7*  --   --   --    Iron/TIBC/Ferritin/ %Sat No results found for: IRON, TIBC, FERRITIN, IRONPCTSAT  Vitals:   03/25/19 0836 03/25/19 0900 03/25/19 1000 03/25/19 1048  BP:  107/63 104/62 (!) 157/83  Pulse:  70 68   Resp:  17 17   Temp:      TempSrc:      SpO2: 92% 94% 94%   Weight:      Height:        Exam Gen on vent, OG tube, writhing about in bed, not responsive, eyes closed No rash, cyanosis or gangrene Sclera anicteric, throat w ETT and OGT  No jvd or  bruits Chest clear bilat no rales or rhonchi RRR no MRG Abd soft ntnd no mass or ascites +bs GU foley draining clear yellow urine w/o sediment MS no joint effusions or deformity Ext diffuse 2-3+ edema, no wounds or ulcers Neuro as above    Home meds:  - furosemide 20 mg prn edema  -  oxycodone 20 qid prn/ bupropion 300 xl qd/ diazepam 5 tid prn/ duloxetine 60 qd/ oxycontin 40 bid/ oxycodone-aceta qid prn/ pregabalin 75 mg bid  - umeclidinium-vilanterol qd/ albuterol nebs prn  - pantoprazole 40 qd/ ranitidine 300 hs   UA 8/19 > negative  CXR 8/31 - bibasilar airspace disease   Vent 40% fiO2   Wt = 115kg (admit 92kg, peak 116kg) = + 23kg  I/O = +44L and - 20L = net +24 L   UOP 8/30 1.6L and 8/31 1.2 L   IV vanc 8/18 - 8/22  IV Cefepime 8/18 - 8/22  IV Rocephin  8/23 - 8/26  IV Meropenem 8/26- current  Assessment/ Plan: 1. Renal failure - subacute over last 10- 12 days in hospital ICU.  No obvious initiator. Did get IV vanc for 5 days ending on 8/22.  No shock and no contrast. UA neg on admit, will repeat. Needs renal US.  Is making urine > 1 L per day. Question if uremia contributing to AMS.  Vol overloaded and is up 23 kg.  Would not recommend RRT just yet. Recommend IV lasix high dose for now. Will follow.  2. VDRF - per CCM 3. Possible HCAP - on IV abx 4. Hx etoh abuse 5. AMS/ confusion/ falls - unclear cause 6. Hx FM/ chronic pain/ depression      Vinson Moselleob Gerilynn Mccullars  MD 03/25/2019, 12:26 PM

## 2019-03-25 NOTE — Progress Notes (Addendum)
NAME:  Stephanie Chen, MRN:  [id number], DOB:  1955/07/27, LOS: 14 ADMISSION DATE:  03/18/2019, CONSULTATION DATE:  03/18/2019 REFERRING MD:  OSH, CHIEF COMPLAINT:  ETOH withdrawal  Brief History   63 y/o F who initially presented to [facility] with AMS.  Initially concerns for possible ETOH withdrawal but encephalopathy persisted. Intubated on arrival for airway protection.    Past Medical History  COPD Fibromyalgia - chronic narcotic use PNA   Significant Hospital Events   8/18 Admit from [facility], intubated on arrival  8/23 Remains encephalopathic 8/24 No purposeful interaction 8/28 Neurology reconsulted for persistent encephalopathy  Consults:  PCCM Neurology    Procedures:  ETT 8/18 >>  Significant Diagnostic Tests:  CT Head 8/18 >> negative  ECHO 8/18 >> mitral regurgitation with moderate LAE MRI 8/21 >> no acute findings or evidence of PRES, moderate chronic small vessel ischemia  EEG 8/24 >> no seizure  Micro Data:  Tracheal aspirate 8/18 >> normal flora  UC 8/19 >> 100k strep viridans BCx2 8/18 >> negative  UC 8/26 >> negative  Tracheal aspirate 8/26 >> few candida BCx2 8/23 >> negative  BCx2 8/26 >> negative   Antimicrobials:  Cefepime 8/18 >> 8/23 Vanc 8/18 >>8/22 Rocephin 8/23 >> 03/19/2019 Vanc 8/26 x1 Meropenem 8/26 >> 8/30  Maxipime 8/30 >>    Interim history/subjective:  Afebrile.  Remains hypertensive. RN reports pt on precedex at max dose.   Objective   Blood pressure (!) 157/83, pulse 68, temperature 99.1 F (37.3 C), temperature source Axillary, resp. rate 17, height 5\' 4"  (1.626 m), weight 115.4 kg, SpO2 94 %.    Vent Mode: PRVC FiO2 (%):  [40 %] 40 % Set Rate:  [10 bmp-18 bmp] 18 bmp Vt Set:  [400 mL-600 mL] 440 mL PEEP:  [5 cmH20] 5 cmH20 Plateau Pressure:  [20 cmH20-28 cmH20] 21 cmH20   Intake/Output Summary (Last 24 hours) at 03/25/2019 1056 Last data filed at 03/25/2019 1051 Gross per 24 hour  Intake 4915.16 ml   Output 1250 ml  Net 3665.16 ml   Filed Weights   03/23/19 0421 03/24/19 0313 03/25/19 0500  Weight: 110.6 kg 116.3 kg 115.4 kg   Examination:  General: elderly female lying in bed on vent, ill appearing  HEENT: MM pink/moist, ETT in place Neuro: eyes open, no tracking, no blink to threat, no follow commands, shifts head side to side CV: s1s2 rrr, no m/r/g PULM:  Even/non-labored, lungs bilaterally with occasional rhonchi  GI: soft, bsx4 active  Extremities: warm/dry, generalized anasarca Skin: multiple bruises to face / neck   Resolved Hospital Problem list     Assessment & Plan:   Acute Metabolic Encephalopathy -MRI, CT, EEG negative  -new AKI  P: Stop precedex gtt  Minimize sedating medications as able  PRN fentanyl  Discontinue haldol   Acute Hypercarbic Respiratory Failure  P: PRVC 8cc/kg  Wean PEEP / FiO2 for sats >90% Follow intermittent CXR   Hypertension P: Metoprolol 25 mg PT BID + PRN Stop clonidine  ICU monitoring   Fever  -possible HCAP Strep Viridans UTI P: Stop cefepime as might contribute to AMS  Follow fever curve / WBC trend  Transaminitis with Hyperbilirubinemia P: Repeat LFT's  Assess Ammonia   EtOH Abuse -family reports 2-3 beers per day  P: Out of window for withdrawal of ETOH  PRN fentanyl for chronic pain  Continue folate, MVI, thiamine   Chronic Pain / Fibromyalgia / Depression Patient was on high doses of oxycodone  at home on Cymbalta 60 at home P: Seen by Neurology, signed off PRN fentanyl only  Hold precedex   AKI  P: Nephrology consulted  Trend BMP / urinary output Replace electrolytes as indicated Avoid nephrotoxic agents, ensure adequate renal perfusion  Mitral Regurgitation with Mod LAE -noted on ECHO P: Outpatient follow up  Thrombocytopenia  -? Chronic element with ETOH but not present on admit, ? Sepsis P: Trend CBC Monitor for bleeding  Transfuse per guidelines  Best practice:  Diet: Vital at  goal Pain/Anxiety/Delirium protocol (if indicated): PRN fenatnyl  VAP protocol (if indicated): Per protocol DVT prophylaxis: Heparin sq GI prophylaxis: h2 Glucose control: n/a Mobility: BR Code Status: Full code  Family Communication: Will call husband 9/1 for update Disposition: ICU  Labs   CBC: Recent Labs  Lab 03/19/19 0500 03/20/19 0353 03/21/19 0412 03/22/19 0447 03/23/19 0425 03/24/19 0310 03/25/19 0409  WBC 15.3* 15.3* 15.1* 10.6* 9.6 15.6* 15.9*  NEUTROABS 12.4* 11.9* 11.9* 8.4* 7.4  --   --   HGB 10.7* 10.0* 10.2* 8.8* 8.0* 10.6* 8.8*  HCT 36.2 34.2* 35.1* 30.1* 28.1* 34.7* 29.3*  MCV 105.8* 105.2* 106.0* 107.1* 109.3* 102.1* 104.6*  PLT 124* 127* 113* 91* 71* 109* 100*    Basic Metabolic Panel: Recent Labs  Lab 03/19/19 0500 03/20/19 0353 03/21/19 0412 03/22/19 0447 03/23/19 0515 03/24/19 0310 03/25/19 0409  NA 143 142 146* 147* 148* 147* 142  K 4.2 4.1 4.7 4.8 5.1 4.8 4.9  CL 112* 111 115* 117* 117* 115* 113*  CO2 23 20* 23 20* 22 18* 16*  GLUCOSE 131* 130* 129* 113* 138* 135* 454*  BUN 74* 90* 112* 123* 133* 142* 145*  CREATININE 1.68* 2.25* 2.88* 3.14* 3.62* 3.82* 3.78*  CALCIUM 7.6* 7.3* 7.8* 7.2* 7.7* 8.0* 7.1*  MG 2.5* 2.4 2.6* 2.3 2.5*  --   --   PHOS 3.4 3.7 5.6* 5.7*  --   --   --    GFR: Estimated Creatinine Clearance: 18.8 mL/min (A) (by C-G formula based on SCr of 3.78 mg/dL (H)). Recent Labs  Lab 03/22/19 0447 03/23/19 0425 03/24/19 0310 03/25/19 0409  WBC 10.6* 9.6 15.6* 15.9*    Liver Function Tests: Recent Labs  Lab 03/20/19 0353 03/25/19 0409  AST 28 50*  ALT 36 61*  ALKPHOS 81 93  BILITOT 0.7 0.5  PROT 6.3* 5.1*  ALBUMIN 2.5* 1.9*   No results for input(s): LIPASE, AMYLASE in the last 168 hours. Recent Labs  Lab 03/19/19 1050 03/21/19 1017  AMMONIA 50* 64*    ABG    Component Value Date/Time   PHART 7.325 (L) 03/23/2019 1155   PCO2ART 41.0 03/23/2019 1155   PO2ART 95.0 03/23/2019 1155   HCO3 20.8  03/23/2019 1155   ACIDBASEDEF 4.4 (H) 03/23/2019 1155   O2SAT 96.6 03/23/2019 1155     Coagulation Profile: No results for input(s): INR, PROTIME in the last 168 hours.  Cardiac Enzymes: No results for input(s): CKTOTAL, CKMB, CKMBINDEX, TROPONINI in the last 168 hours.  HbA1C: No results found for: HGBA1C  CBG: Recent Labs  Lab 03/24/19 1513 03/24/19 1934 03/24/19 2309 03/25/19 0322 03/25/19 0753  GLUCAP 152* 142* 147* 139* 141*    CRITICAL CARE Performed by: Canary BrimBrandi Ollis   Total critical care time: 35 minutes  Critical care time was exclusive of separately billable procedures and treating other patients.  Critical care was necessary to treat or prevent imminent or life-threatening deterioration.  Critical care was time spent personally by me  on the following activities: development of treatment plan with patient and/or surrogate as well as nursing, discussions with consultants, evaluation of patient's response to treatment, examination of patient, obtaining history from patient or surrogate, ordering and performing treatments and interventions, ordering and review of laboratory studies, ordering and review of radiographic studies, pulse oximetry and re-evaluation of patient's condition.   Noe Gens, NP-C [place] [facility] Pgr: [phone] or if no answer [phone] 03/25/2019, 10:56 AM  Attending Note:  63 year old alcoholic female with respiratory failure and renal failure at this point, concern for hypertensive encephalopathy with a negative EEG and MRI at this point.  Remains agitated and non-purposeful overnight.  On exam, not following command with a non-focal exam and coarse BS.  BUN extremely elevated and UOP is poor.  I reviewed CXR myself, ETT is in a good position.  Pulmonary edema noted.  Discussed with PCCM-NP.  Called nephrology on consultation.  High dose lasix today and if not successful then transfer to [facility] for [facility].  Minimize sedation in the  meantime.  PCCM will continue to manage.  The patient is critically ill with multiple organ systems failure and requires high complexity decision making for assessment and support, frequent evaluation and titration of therapies, application of advanced monitoring technologies and extensive interpretation of multiple databases.   Critical Care Time devoted to patient care services described in this note is  32  Minutes. This time reflects time of care of this signee Dr Jennet Maduro. This critical care time does not reflect procedure time, or teaching time or supervisory time of PA/NP/Med student/Med Resident etc but could involve care discussion time.  Rush Farmer, M.D. [facility] Pulmonary/Critical Care Medicine. Pager: [phone]. After hours pager: [phone].

## 2019-03-25 NOTE — Progress Notes (Signed)
Replaced PT ETT holder (with RN as 2nd person)- uneventful (resides at 22cm lip).

## 2019-03-25 NOTE — Progress Notes (Addendum)
At 2230 pt became increasingly agitated, thrashing head back and forth, and respirations increasing to 35-37/min. RN gave both prn doses of haldol and fentanyl. At 2330 pt agitation and respirations still not improved so RN notified E-link RN who stated she would discuss with the MD. Awaiting further orders.

## 2019-03-25 NOTE — Progress Notes (Signed)
Husband called for update on patients status.  Reviewed patients plan of care.  He indicates she would not likely want HD.  I reviewed our plans for lasix and lactulose.  We will plan to revisit labs in the am.  The family will discuss concept of HD.  They may choose to proceed with comfort measures. They would not want trach.     Daughter Stephanie Chen [phone]) called at husbands request.  [name] is an Therapist, sports.  We reviewed her mother's history of admit.  She offers that she has had a difficult time over the last year due to pain control.  She recently retired at the end of July.  She states she had been "saving" her pain meds during the day and doubling up at night.  She drinks 3-4 beers per evening.  Additionally, the daughter noted while cleaning the patients home that she had "bottles and bottles" of empty tylenol PM.  She feels she has been self medicating over the last year. We reviewed plan of care to include Nephrology consultation, lactulose administration and review of labs for the am.  If labs are not improved in the next 24-48 hours, they may elect for comfort measures.  Daughter understands that if they do not want HD, she would have progressive renal failure and not survive.  Will continue supportive care, limit sedating medications as able.  PRN fentanyl OK.  Hold precedex if able. Will plan to revisit with family 9/2.   Time spent with family - 35 minutes   Noe Gens, NP-C [place] [facility] Pgr: [phone] or if no answer [phone] 03/25/2019, 3:38 PM

## 2019-03-25 NOTE — Progress Notes (Signed)
Nutrition Follow-up  DOCUMENTATION CODES:   Obesity unspecified  INTERVENTION:  - continue Vital High Protein @ 40 ml/hr with 30 ml prostat once/day.  - continue to monitor medical course and changes.   NUTRITION DIAGNOSIS:   Inadequate oral intake related to inability to eat as evidenced by NPO status. -ongoing  GOAL:   Provide needs based on ASPEN/SCCM guidelines -met with TF regimen  MONITOR:   Vent status, TF tolerance, Labs, Weight trends  ASSESSMENT:   63 year old with medical history of COPD and alcohol abuse. She presented to the ED with AMS (confusion x2 weeks PTA) and OSH. Her daughter reported that the patient was also experiencing hallucinations, increased weakness, and had multiple falls PTA. Over the past 7 days, she may have been using her inhalers more frequently. She was tremulous at the time of presentation and there was concern for DTs with acute alcohol withdrawal. She was given multiple doses of Ativan and was started on Precedex. Husband reported that patient drinks 2-3 beers/day. Upon arrival to the ICU, she was poorly responsive, not protecting her airway, and was abdominally breath. She was also febrile.  Weight continues to trend up. Patient remains intubated with OGT in place and she is receiving Vital High Protein @ 40 ml/hr with 30 ml prostat BID. This regimen is providing 1160 kcal, 114 grams protein, and 802 ml free water. Spoke with RN at bedside who reports no issues with TF this shift. Patient was very agitated last night but is calm and resting at this time. Flow sheet indicates that patient has deep pitting edema to BUE and moderate pitting edema to BLE.   Per notes: - encephalopathy--moderate chronic small vessel ischemia, EEG 8/24 showed no s/s of seizures, possible need for MRI brain and re-consult Neuro - acute respiratory failure--full vent support - fever with probable HCAP - transaminitis with hyperbilirubinemia--resolved  - chronic  pain/fibromyalgia--Neuro signed off 8/27, scheduled narcotics stopped 8/28 - renal insufficiency - thrombocytopenia    Patient is currently intubated on ventilator support MV: 8.8 L/min Temp (24hrs), Avg:99.3 F (37.4 C), Min:98.1 F (36.7 C), Max:101.5 F (38.6 C) Propofol: none BP: 104/62 and MAP: 72  Labs reviewed; CBGs: 139 and 141 mg/dl today, Cl: 113 mmol/l, BUN: 145 mg/dl, creatinine: 3.78 mg/dl, Ca: 7.1 mg/dl, LFTs elevated.  Medications reviewed; 20 mg pepcid per OGT/day, 30 g lactulose BID, 15 ml liquid multivitamin/day, 100 mg thiamine per OGT/day. IVF; D5-1/2 NS @ 125 kcal (510 kcal)   Diet Order:   Diet Order            Diet NPO time specified  Diet effective now              EDUCATION NEEDS:   No education needs have been identified at this time  Skin:  Skin Assessment: Reviewed RN Assessment  Last BM:  8/31  Height:   Ht Readings from Last 1 Encounters:  03/19/19 '5\' 4"'  (1.626 m)    Weight:   Wt Readings from Last 1 Encounters:  03/25/19 115.4 kg    Ideal Body Weight:  54.5 kg  BMI:  Body mass index is 43.67 kg/m.  Estimated Nutritional Needs:   Kcal:  8828-0034 kcal  Protein:  >/= 109 grams  Fluid:  >/= 1.8 L/day     Jarome Matin, MS, RD, LDN, [facility] Inpatient Clinical Dietitian Pager # [phone] After hours/weekend pager # [phone]

## 2019-03-25 NOTE — Progress Notes (Signed)
eLink Physician-Brief Progress Note Patient Name: Stephanie Chen DOB: 11-06-1955 MRN: [id number]   Date of Service  03/25/2019  HPI/Events of Note  Notified by RN that patient was restless and agitated in bed.  On video assessment, pt is moving her head side to side, seemed uncomfortable.  Pt is on precedex, received haldol and fentanyl 22mcg with no effect.    eICU Interventions  Give Fentanyl 119mcg x 1.      Intervention Category Minor Interventions: Agitation / anxiety - evaluation and management  Elsie Lincoln 03/25/2019, 12:48 AM

## 2019-03-25 DEATH — deceased

## 2019-03-26 ENCOUNTER — Inpatient Hospital Stay (HOSPITAL_COMMUNITY): Payer: Commercial Managed Care - PPO

## 2019-03-26 DIAGNOSIS — Z515 Encounter for palliative care: Secondary | ICD-10-CM

## 2019-03-26 DIAGNOSIS — Z7189 Other specified counseling: Secondary | ICD-10-CM

## 2019-03-26 LAB — GLUCOSE, CAPILLARY
Glucose-Capillary: 144 mg/dL — ABNORMAL HIGH (ref 70–99)
Glucose-Capillary: 139 mg/dL — ABNORMAL HIGH (ref 70–99)
Glucose-Capillary: 126 mg/dL — ABNORMAL HIGH (ref 70–99)
Glucose-Capillary: 124 mg/dL — ABNORMAL HIGH (ref 70–99)

## 2019-03-26 LAB — HEPATIC FUNCTION PANEL
ALT: 106 U/L — ABNORMAL HIGH (ref 0–44)
AST: 113 U/L — ABNORMAL HIGH (ref 15–41)
Albumin: 2 g/dL — ABNORMAL LOW (ref 3.5–5.0)
Alkaline Phosphatase: 100 U/L (ref 38–126)
Bilirubin, Direct: 0.2 mg/dL (ref 0.0–0.2)
Indirect Bilirubin: 0.4 mg/dL (ref 0.3–0.9)
Total Bilirubin: 0.6 mg/dL (ref 0.3–1.2)
Total Protein: 5.6 g/dL — ABNORMAL LOW (ref 6.5–8.1)

## 2019-03-26 LAB — BASIC METABOLIC PANEL
Anion gap: 14 (ref 5–15)
BUN: 167 mg/dL — ABNORMAL HIGH (ref 8–23)
CO2: 17 mmol/L — ABNORMAL LOW (ref 22–32)
Calcium: 7.9 mg/dL — ABNORMAL LOW (ref 8.9–10.3)
Chloride: 110 mmol/L (ref 98–111)
Creatinine, Ser: 4.15 mg/dL — ABNORMAL HIGH (ref 0.44–1.00)
GFR calc Af Amer: 12 mL/min — ABNORMAL LOW (ref >60)
GFR calc non Af Amer: 11 mL/min — ABNORMAL LOW (ref >60)
Glucose, Bld: 324 mg/dL — ABNORMAL HIGH (ref 70–99)
Potassium: 4.8 mmol/L (ref 3.5–5.1)
Sodium: 141 mmol/L (ref 135–145)

## 2019-03-26 LAB — CBC
HCT: 28.3 % — ABNORMAL LOW (ref 36.0–46.0)
Hemoglobin: 8.5 g/dL — ABNORMAL LOW (ref 12.0–15.0)
MCH: 31.3 pg (ref 26.0–34.0)
MCHC: 30 g/dL (ref 30.0–36.0)
MCV: 104 fL — ABNORMAL HIGH (ref 80.0–100.0)
Platelets: 94 10*3/uL — ABNORMAL LOW (ref 150–400)
RBC: 2.72 MIL/uL — ABNORMAL LOW (ref 3.87–5.11)
RDW: 16.3 % — ABNORMAL HIGH (ref 11.5–15.5)
WBC: 16.5 10*3/uL — ABNORMAL HIGH (ref 4.0–10.5)
nRBC: 0 % (ref 0.0–0.2)

## 2019-03-26 MED ORDER — MIDAZOLAM HCL 2 MG/2ML IJ SOLN
1.0000 mg | INTRAMUSCULAR | Status: DC | PRN
  Administered 2019-03-26: 19:00:00 2 mg via INTRAVENOUS
  Filled 2019-03-26: qty 2

## 2019-03-26 MED ORDER — MORPHINE 100MG IN NS 100ML (1MG/ML) PREMIX INFUSION
0.0000 mg/h | INTRAVENOUS | Status: DC
  Administered 2019-03-26: 8 mg/h via INTRAVENOUS
  Filled 2019-03-26: qty 100

## 2019-03-26 MED ORDER — ACETAMINOPHEN 650 MG RE SUPP: 650.0000 mg | Freq: Four times a day (QID) | RECTAL | Status: DC | PRN

## 2019-03-26 MED ORDER — GLYCOPYRROLATE 0.2 MG/ML IJ SOLN: 0.2000 mg | INTRAMUSCULAR | Status: DC | PRN

## 2019-03-26 MED ORDER — MORPHINE BOLUS VIA INFUSION
5.0000 mg | INTRAVENOUS | Status: DC | PRN
  Filled 2019-03-26: qty 5

## 2019-03-26 MED ORDER — GLYCOPYRROLATE 1 MG PO TABS: 1.0000 mg | ORAL_TABLET | ORAL | Status: DC | PRN

## 2019-03-26 MED ORDER — DIPHENHYDRAMINE HCL 50 MG/ML IJ SOLN: 25.0000 mg | INTRAMUSCULAR | Status: DC | PRN

## 2019-03-26 MED ORDER — MORPHINE 100MG IN NS 100ML (1MG/ML) PREMIX INFUSION
1.0000 mg/h | INTRAVENOUS | Status: DC
  Administered 2019-03-26: 10:00:00 1 mg/h via INTRAVENOUS
  Filled 2019-03-26: qty 100

## 2019-03-26 MED ORDER — DEXTROSE 5 % IV SOLN: INTRAVENOUS | Status: DC

## 2019-03-26 MED ORDER — POLYVINYL ALCOHOL 1.4 % OP SOLN
1.0000 [drp] | Freq: Four times a day (QID) | OPHTHALMIC | Status: DC | PRN
  Filled 2019-03-26: qty 15

## 2019-03-26 MED ORDER — MORPHINE SULFATE (PF) 2 MG/ML IV SOLN: 2.0000 mg | INTRAVENOUS | Status: DC | PRN

## 2019-03-26 MED ORDER — ACETAMINOPHEN 325 MG PO TABS: 650.0000 mg | ORAL_TABLET | Freq: Four times a day (QID) | ORAL | Status: DC | PRN

## 2019-03-27 ENCOUNTER — Telehealth: Payer: Self-pay

## 2019-03-27 NOTE — Telephone Encounter (Signed)
Received dc from [facility].  DC is for cremation and a patient of Doctor Nelda Marseille.   DC will be taken to [place] for signature.  On 03/28/2019 Received signed dc back from Doctor Nelda Marseille. I called the funeral home to let them know the dc was faxed per their request.

## 2019-04-01 ENCOUNTER — Telehealth: Payer: Self-pay | Admitting: Pulmonary Disease

## 2019-04-01 NOTE — Telephone Encounter (Signed)
04/01/2019 Received original DC from [facility] I will hold it till Dr. [name] gets back on Thursday to sign at 31M. PWR   04/07/2019 Received D/C back from Dr.Yacoub I will [facility] to pick up. PWR

## 2019-04-24 NOTE — Progress Notes (Signed)
2000-Patient asystole on monitor, No noted heart tones or lung sounds on ausculation. Verified with 2nd RN Haley Romines. Family visiting at bedside and made aware. Reagan and Attending notified of patient death.

## 2019-04-24 NOTE — Progress Notes (Addendum)
NAME:  Stephanie Chen, MRN:  [id number], DOB:  [birth year], LOS: 15 ADMISSION DATE:  03/07/2019, CONSULTATION DATE:  03/13/2019 REFERRING MD:  OSH, CHIEF COMPLAINT:  ETOH withdrawal  Brief History   63 y/o F who initially presented to [facility] with AMS.  Initially concerns for possible ETOH withdrawal but encephalopathy persisted. Intubated on arrival for airway protection.    Past Medical History  COPD Fibromyalgia - chronic narcotic use PNA   Significant Hospital Events   8/18 Admit from [facility], intubated on arrival  8/23 Remains encephalopathic 8/24 No purposeful interaction 8/28 Neurology reconsulted for persistent encephalopathy  Consults:  PCCM Neurology    Procedures:  ETT 8/18 >>  Significant Diagnostic Tests:  CT Head 8/18 >> negative  ECHO 8/18 >> mitral regurgitation with moderate LAE MRI 8/21 >> no acute findings or evidence of PRES, moderate chronic small vessel ischemia  EEG 8/24 >> no seizure  Micro Data:  Tracheal aspirate 8/18 >> normal flora  UC 8/19 >> 100k strep viridans BCx2 8/18 >> negative  UC 8/26 >> negative  Tracheal aspirate 8/26 >> few candida BCx2 8/23 >> negative  BCx2 8/26 >> negative   Antimicrobials:  Cefepime 8/18 >> 8/23 Vanc 8/18 >>8/22 Rocephin 8/23 >> 03/19/2019 Vanc 8/26 x1 Meropenem 8/26 >> 8/30  Maxipime 8/30 >> 9/2  Interim history/subjective:  Afebrile. Remains off continuous sedation.    Objective   Blood pressure 115/72, pulse 75, temperature (!) 97.4 F (36.3 C), temperature source Axillary, resp. rate (!) 21, height 5\' 4"  (1.626 m), weight 116.7 kg, SpO2 93 %.    Vent Mode: PRVC FiO2 (%):  [30 %-40 %] 30 % Set Rate:  [18 bmp] 18 bmp Vt Set:  [440 mL] 440 mL PEEP:  [5 cmH20] 5 cmH20 Plateau Pressure:  [15 cmH20-28 cmH20] 15 cmH20   Intake/Output Summary (Last 24 hours) at 03/27/2019 0857 Last data filed at 03/27/2019 0600 Gross per 24 hour  Intake 4349.44 ml  Output 1400 ml  Net 2949.44 ml   Filed Weights   03/24/19 0313 03/25/19 0500 04/09/2019 0408  Weight: 116.3 kg 115.4 kg 116.7 kg   Examination:  General: chronically ill appearing female lying in bed on vent    HEENT: MM pink/moist, ETT  Neuro: opens eyes to voice, no tracking, no follow commands  CV: s1s2 rrr, no m/r/g PULM: increased effort but not labored, lungs bilaterally coarse with rhonchi  GI: soft, bsx4 active, flexi seal in place  Extremities: warm/dry, anasarca  Skin: multiple areas of bruising  Resolved Hospital Problem list     Assessment & Plan:   Acute Metabolic Encephalopathy -MRI, CT, EEG negative  -new AKI  P: Begin low dose morphine for pain / discomfort   Acute Hypercarbic Respiratory Failure  P: PRVC 8cc/kg  Wean PEEP / FiO2 for sats >90% Follow intermittent CXR  Family has indicated they would not want trach or prolonged measures of support, see below  Hypertension P: Continue lopressor 25 mg BID PT  ICU monitoring of hemodynamics   Fever  -possible HCAP Strep Viridans UTI P: Follow off cefepime  Monitor WBC trend / fever curve   Transaminitis with Hyperbilirubinemia P: Trend LFT's   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 Chronic element with ETOH but not present on admit, ? Sepsis P: Monitor platelets, evidence of bleeding  Transfuse per ICU guidelines   Best practice:  Diet: Vital at goal Pain/Anxiety/Delirium protocol (if indicated): PRN fenatnyl  VAP protocol (if indicated): Per protocol  DVT prophylaxis: Heparin sq GI prophylaxis: h2 Glucose control: n/a Mobility: BR Code Status: Full code  Family Communication: Daughter [name]) called for update 9/2. Family has decided they do not want to proceed with further aggressive care including HD, CPR.  They would like to come visit 9/3 and plan for withdrawal of mechanical ventilation and allow for natural death. Anticipate hours to day.   Disposition: ICU  Labs   CBC: Recent Labs  Lab 03/20/19 0353 03/21/19 0412 03/22/19 0447 03/23/19 0425 03/24/19 0310 03/25/19 0409 2019-04-20 0359  WBC 15.3* 15.1* 10.6* 9.6 15.6* 15.9* 16.5*  NEUTROABS 11.9* 11.9* 8.4* 7.4  --   --   --   HGB 10.0* 10.2* 8.8* 8.0* 10.6* 8.8* 8.5*  HCT 34.2* 35.1* 30.1* 28.1* 34.7* 29.3* 28.3*  MCV 105.2* 106.0* 107.1* 109.3* 102.1* 104.6* 104.0*  PLT 127* 113* 91* 71* 109* 100* 94*    Basic Metabolic Panel: Recent Labs  Lab 03/20/19 0353 03/21/19 0412 03/22/19 0447 03/23/19 0515 03/24/19 0310 03/25/19 0409 April 20, 2019 0359  NA 142 146* 147* 148* 147* 142 141  K 4.1 4.7 4.8 5.1 4.8 4.9 4.8  CL 111 115* 117* 117* 115* 113* 110  CO2 20* 23 20* 22 18* 16* 17*  GLUCOSE 130* 129* 113* 138* 135* 454* 324*  BUN 90* 112* 123* 133* 142* 145* 167*  CREATININE 2.25* 2.88* 3.14* 3.62* 3.82* 3.78* 4.15*  CALCIUM 7.3* 7.8* 7.2* 7.7* 8.0* 7.1* 7.9*  MG 2.4 2.6* 2.3 2.5*  --   --   --   PHOS 3.7 5.6* 5.7*  --   --   --   --    GFR: Estimated Creatinine Clearance: 17.2 mL/min (A) (by C-G formula based on SCr of 4.15 mg/dL (H)). Recent Labs  Lab 03/23/19 0425 03/24/19 0310 03/25/19 0409 April 20, 2019 0359  WBC 9.6 15.6* 15.9* 16.5*    Liver Function Tests: Recent Labs  Lab 03/20/19 0353 03/25/19 0409 04-20-19 0359  AST 28 50* 113*  ALT 36 61* 106*  ALKPHOS 81 93 100  BILITOT 0.7 0.5 0.6  PROT 6.3* 5.1* 5.6*  ALBUMIN 2.5* 1.9* 2.0*   No results for input(s): LIPASE, AMYLASE in the last 168 hours. Recent Labs  Lab 03/19/19 1050 03/21/19  1017 03/25/19 1205  AMMONIA 50* 64* 91*    ABG    Component Value Date/Time   PHART 7.325 (L) 03/23/2019 1155   PCO2ART 41.0 03/23/2019 1155   PO2ART 95.0 03/23/2019 1155   HCO3 20.8 03/23/2019 1155   ACIDBASEDEF 4.4 (H) 03/23/2019 1155   O2SAT 96.6 03/23/2019 1155     Coagulation Profile: No results for input(s): INR, PROTIME in the last 168 hours.  Cardiac Enzymes: No results for input(s): CKTOTAL, CKMB, CKMBINDEX, TROPONINI in the last 168 hours.  HbA1C: No results found for: HGBA1C  CBG: Recent Labs  Lab 03/25/19 1118 03/25/19 1536 03/25/19 2350 20-Apr-2019 0359 Apr 20, 2019 0757  GLUCAP 158* 148* 155* 144* 139*    CC Time: 40 minutes   Noe Gens, NP-C [place] [facility] Pgr: [phone] or if no answer [phone] 04/09/2019, 8:57 AM  Attending Note:  63 year old alcoholic female with altered mental status and respiratory failure.  On exam, she is in painful distress and not weaning with coarse BS diffusely.  I reviewed CXR myself, ETT is in a good position and infiltrate noted.  Discussed with PCCM-NP.  Had a discussion with the family.  Will start morphine.  Emphasize comfort.  No trach.  No further blood draw.  Family to arrive on 9/3 at 10 AM for withdrawal of the ventilator.  Will keep comfortable in the meantime and on levophed for BP support.  PCCM will continue to manage.  At 6 PM called by bedside RN.  Patient's family is all here and do not believe that the patient appears comfortable and would like to proceed with withdrawal tonight.  Spoke to husband and family, confirmed that this is how they would like to proceed.  Placed withdrawal orderset and will proceed with withdrawal.  The patient is critically ill with multiple organ systems failure and requires high complexity decision making for assessment and support, frequent evaluation and titration of therapies, application of advanced monitoring technologies and extensive interpretation of multiple  databases.   Critical Care Time devoted to patient care services described in this note is  90  Minutes. This time reflects time of care of this signee Dr Koren BoundWesam . This critical care time does not reflect procedure time, or teaching time or supervisory time of PA/NP/Med student/Med Resident etc but could involve care discussion time.  Alyson ReedyWesam G. , M.D. [facility]/Critical Care Medicine. Pager: [phone]. After hours pager: [phone].

## 2019-04-24 NOTE — Procedures (Signed)
Extubation Procedure Note  Patient Details:   Name: Stephanie Chen DOB: February 28, 1956 MRN: [id number]   Airway Documentation:    Vent end date: 03/25/2019 Vent end time: 1840   Evaluation  O2 sats: stable throughout Complications: No apparent complications Patient did tolerate procedure well. Bilateral Breath Sounds: Diminished, Rhonchi   No  Baldwin [place] Nannette 04/05/2019, 6:49 PM  PT extubated per order and family request.

## 2019-04-24 NOTE — Death Summary Note (Signed)
DEATH SUMMARY   Patient Details  Name: Stephanie Chen MRN: [id number] DOB: [birth year]  Admission/Discharge Information   Admit Date:  03/15/2019  Date of Death: Date of Death: 30-Mar-2019  Time of Death: Time of Death: 02-Dec-1957  Length of Stay: 2022/12/10  Referring Physician: Philemon Kingdom, MD   Reason(s) for Hospitalization  Alcohol withdrawal  Diagnoses  Preliminary cause of death:   Alcohol withdrawal  Secondary Diagnoses (including complications and co-morbidities):  Active Problems:   Alcohol withdrawal (HCC)   Acute respiratory failure with hypoxia and hypercapnia (HCC)   Toxic metabolic encephalopathy   Endotracheal tube present   Essential hypertension   HCAP (healthcare-associated pneumonia)   Acute kidney failure, unspecified (HCC)   Thrombocytopenia (HCC)   AKI (acute kidney injury) [facility])   Brief Hospital Course (including significant findings, care, treatment, and services provided and events leading to death)  63 y/o F who initially presented to [facility] with AMS.  Initially concerns for possible ETOH withdrawal but encephalopathy persisted. Intubated on arrival for airway protection.   Had a discussion with the family.  Will start morphine.  Emphasize comfort.  No trach.  No further blood draw.  Family to arrive on 9/3 at 10 AM for withdrawal of the ventilator.  Will keep comfortable in the meantime and on levophed for BP support.  PCCM will continue to manage.  At 6 PM called by bedside RN.  Patient's family is all here and do not believe that the patient appears comfortable and would like to proceed with withdrawal tonight.  Spoke to husband and family, confirmed that this is how they would like to proceed.  Placed withdrawal orderset and will proceed with withdrawal.  Morphine was started, patient was extubated to expire shortly thereafter with the family bedside.    Pertinent Labs and Studies  Significant Diagnostic Studies Dg Abd 1 View  Result Date:  Mar 15, 2019 CLINICAL DATA:  Initial evaluation for enteric tube placement. EXAM: ABDOMEN - 1 VIEW COMPARISON:  None available. FINDINGS: Enteric tube in place with tip overlying the gastric body, side hole beyond the GE junction. Tip projects inferiorly. Visualized bowel gas pattern within normal limits. Surgical clips overlie the partially visualized right abdomen. Scattered atherosclerotic calcifications noted. Postsurgical changes noted within the lower lumbar spine. IMPRESSION: Enteric tube in place with tip overlying the gastric body, side hole beyond the GE junction. Tip projects inferiorly. Electronically Signed   By: Rise Mu M.D.   On: Mar 15, 2019 21:31   Ct Head Wo Contrast  Result Date: 03/15/2019 CLINICAL DATA:  Altered mental status, unclear cause EXAM: CT HEAD WITHOUT CONTRAST TECHNIQUE: Contiguous axial images were obtained from the base of the skull through the vertex without intravenous contrast. COMPARISON:  Same-day head CT FINDINGS: Brain: No evidence of acute infarction, hemorrhage, hydrocephalus, extra-axial collection or mass lesion/mass effect. Symmetric prominence of the ventricles, cisterns and sulci compatible with parenchymal volume loss. Patchy areas of white matter hypoattenuation are most compatible with chronic microvascular angiopathy. Vascular: Atherosclerotic calcification of the carotid siphons and intradural vertebral arteries. Skull: No calvarial fracture or suspicious osseous lesion. No scalp swelling or hematoma. Sinuses/Orbits: Minimal mural disease in the anterior ethmoids. Remainder of the paranasal sinuses and mastoid air cells are predominantly clear. Orbital structures are unremarkable. Other: None. IMPRESSION: No acute intracranial abnormality. Electronically Signed   By: Kreg Shropshire M.D.   On: 2019/03/15 20:55   Mr Brain Wo Contrast  Result Date: 03/14/2019 CLINICAL DATA:  Encephalopathy. Concern for posterior reversible encephalopathy syndrome  EXAM:  MRI HEAD WITHOUT CONTRAST TECHNIQUE: Multiplanar, multiecho pulse sequences of the brain and surrounding structures were obtained without intravenous contrast. COMPARISON:  Head CT from 3 days ago FINDINGS: Brain: No acute infarction, hemorrhage, hydrocephalus, extra-axial collection or mass lesion. No evidence of posterior reversible encephalopathy syndrome. Moderate FLAIR hyperintensity in the deep cerebral white matter attributed to chronic small vessel ischemia given age and history. Vascular: Major flow voids are preserved Skull and upper cervical spine: Negative for marrow lesion Sinuses/Orbits: Negative IMPRESSION: 1. No acute finding including evidence of posterior reversible encephalopathy syndrome. 2. Moderate chronic small vessel ischemia. Electronically Signed   By: Monte Fantasia M.D.   On: 03/14/2019 18:59   US Renal  Result Date: 03/25/2019 CLINICAL DATA:  Acute renal injury EXAM: RENAL / URINARY TRACT ULTRASOUND COMPLETE COMPARISON:  None. FINDINGS: Right Kidney: Renal measurements: 10.8 x 3.8 x 3.9 cm = volume: 83 mL . Echogenicity within normal limits. No mass or hydronephrosis visualized. Left Kidney: Renal measurements: 11.7 x 6.0 x 4.8 cm = volume: 173 mL. Echogenicity within normal limits. No mass or hydronephrosis visualized. Moderate volume ascites along the liver margin.  Pleural effusions. Bladder: Bladder collapsed around Foley catheter. IMPRESSION: No hydronephrosis. Moderate volume ascites. Electronically Signed   By: Suzy Bouchard M.D.   On: 03/25/2019 14:22   Dg Chest Port 1 View  Result Date: 04/11/2019 CLINICAL DATA:  Respiratory failure EXAM: PORTABLE CHEST 1 VIEW COMPARISON:  Radiograph March 24, 2019 FINDINGS: Right upper extremity PICC terminates in the lower SVC. Endotracheal tube terminates in the mid trachea, 3 cm from the carina. Transesophageal tube tip and side port are distal to the GE junction. Right anterior obliquity superimposes much of the enlarged cardiac  silhouette over the left lung base. There is bilateral lower lung airspace opacity, left greater than right which is somewhat accentuated by this rotation. Features are not significantly changed from 1 day prior no acute osseous or soft tissue abnormality. IMPRESSION: Stable bilateral lower lung airspace disease, left greater than right. Electronically Signed   By: Lovena Le M.D.   On: 03/25/2019 05:53   Dg Chest Port 1 View  Result Date: 03/24/2019 CLINICAL DATA:  Acute respiratory failure with hypoxemia. EXAM: PORTABLE CHEST 1 VIEW COMPARISON:  03/22/2019 FINDINGS: Endotracheal tube, right arm PICC line, and nasogastric tube remain in appropriate position. Patient is rotated to the left. Stable cardiomegaly. Bilateral lower lung airspace opacity is again seen, left side greater than right. This shows mild worsening in the right lung base. IMPRESSION: 1. Bilateral lower lung airspace disease, with mild worsening in the right lung base. 2. Stable cardiomegaly. Electronically Signed   By: Marlaine Hind M.D.   On: 03/24/2019 05:26   Dg Chest Port 1 View  Result Date: 03/22/2019 CLINICAL DATA:  Respiratory failure EXAM: PORTABLE CHEST 1 VIEW COMPARISON:  03/21/2019 FINDINGS: Endotracheal tube tip is at the level of the clavicular heads. Orogastric tube side port is below the field of view. Mild cardiomegaly is unchanged. Consolidation of the retrocardiac left lung base is unchanged. IMPRESSION: Endotracheal tube tip at the level of the clavicular heads. Unchanged left basilar consolidation. Electronically Signed   By: Ulyses Jarred M.D.   On: 03/22/2019 06:25   Dg Chest Port 1 View  Result Date: 03/21/2019 CLINICAL DATA:  Respiratory failure. EXAM: PORTABLE CHEST 1 VIEW COMPARISON:  Radiograph of March 20, 2019. FINDINGS: Stable cardiomegaly. Endotracheal and nasogastric tubes are unchanged in position. No pneumothorax is noted. Right-sided PICC line is unchanged.  Stable bibasilar subsegmental  atelectasis or edema is noted. Small pleural effusions cannot be excluded. Bony thorax is unremarkable. IMPRESSION: Stable support apparatus. Stable bibasilar subsegmental atelectasis or edema is noted. Electronically Signed   By: Lupita Raider M.D.   On: 03/21/2019 07:15   Dg Chest Port 1 View  Result Date: 03/20/2019 CLINICAL DATA:  Respiratory failure EXAM: PORTABLE CHEST 1 VIEW COMPARISON:  Yesterday FINDINGS: Endotracheal tube tip at the clavicular heads. The orogastric tube at least reaches the stomach. Right PICC with tip at the SVC. Cardiomegaly. Indistinct opacity at both lung bases. No definite effusion. No pneumothorax. IMPRESSION: Stable hardware positioning and bilateral lower lobe opacification. Electronically Signed   By: Marnee Spring M.D.   On: 03/20/2019 08:33   Dg Chest Port 1 View  Result Date: 03/19/2019 CLINICAL DATA:  Respiratory failure EXAM: PORTABLE CHEST 1 VIEW COMPARISON:  03/16/2019 FINDINGS: Endotracheal tube in good position. NG tube enters the stomach with the tip not visualized. Worsening bilateral airspace disease mostly in the bases. Small pleural effusions. Diffuse vascular congestion. Right arm PICC tip in the SVC. IMPRESSION: Worsening lung aeration bilaterally with increased bilateral airspace disease. Favor edema over pneumonia. Endotracheal tube remains in good position. Right arm PICC tip in the SVC. Electronically Signed   By: Marlan Palau M.D.   On: 03/19/2019 08:18   Dg Chest Port 1 View  Result Date: 03/16/2019 CLINICAL DATA:  Alcohol withdrawal. EXAM: PORTABLE CHEST 1 VIEW COMPARISON:  Chest radiograph 03/14/2019 FINDINGS: Right upper extremity PICC line tip projects over the superior vena cava. Monitoring leads overlie the patient. ET tube terminates in the mid trachea. Enteric tube courses inferior to the diaphragm. Stable enlarged cardiac and mediastinal contours. Small left pleural effusion. Heterogeneous opacities left lung base. No pneumothorax.  IMPRESSION: ET tube mid trachea. Small left pleural effusion with underlying consolidation favored to represent atelectasis. Electronically Signed   By: Annia Belt M.D.   On: 03/16/2019 10:45   Dg Chest Port 1 View  Result Date: 03/14/2019 CLINICAL DATA:  Hypoxia EXAM: PORTABLE CHEST 1 VIEW COMPARISON:  March 12, 2019 FINDINGS: Endotracheal tube tip is 2.6 cm above the carina. Nasogastric tube tip and side port are below the diaphragm. No pneumothorax. There are pleural effusions bilaterally with consolidation in the left base. The lungs elsewhere are clear. There is cardiomegaly with pulmonary vascularity within normal limits. No adenopathy. There is aortic atherosclerosis. No bone lesions. IMPRESSION: Tube positions as described without pneumothorax. Small pleural effusions bilaterally with left lower lobe consolidation. Stable cardiomegaly. Aortic Atherosclerosis (ICD10-I70.0). Electronically Signed   By: Bretta Bang III M.D.   On: 03/14/2019 06:54   Dg Chest Port 1 View  Result Date: 03/12/2019 CLINICAL DATA:  Respiratory failure. EXAM: PORTABLE CHEST 1 VIEW COMPARISON:  One-view chest x-ray 03/24/2019 FINDINGS: Endotracheal tube terminates 2 cm above the carina. NG tube courses off the inferior border of the film. Heart is enlarged. Mild pulmonary vascular congestion is present. Retrocardiac opacification is stable. There is no edema or effusion. Atherosclerotic calcifications are present at the aorta. IMPRESSION: 1. Cardiomegaly and moderate pulmonary vascular congestion is similar the prior exam. 2. Retrocardiac opacification likely reflects atelectasis. Infection is not excluded. 3. Endotracheal tube terminates 2 cm above the carina. Electronically Signed   By: Marin Roberts M.D.   On: 03/12/2019 06:53   Portable Chest X-ray  Result Date: 02/28/2019 CLINICAL DATA:  Status post intubation today. EXAM: PORTABLE CHEST 1 VIEW COMPARISON:  Single-view of the chest earlier today.  FINDINGS: Endotracheal tube is in place with the tip in good position approximately 4 cm above the carina. The patient is rotated on the exam. Lungs are grossly clear. Heart size is enlarged. No pneumothorax. IMPRESSION: ETT in good position. Cardiomegaly. Electronically Signed   By: Drusilla Kannerhomas  Dalessio M.D.   On: 29-Sep-2018 18:58   [place] Ekg Site Rite  Result Date: 03/13/2019 If Site Rite image not attached, placement could not be confirmed due to current cardiac rhythm.  [place] Abdomen Limited Ruq  Result Date: 03/12/2019 CLINICAL DATA:  Hyperbilirubinemia EXAM: ULTRASOUND ABDOMEN LIMITED RIGHT UPPER QUADRANT COMPARISON:  None. FINDINGS: Gallbladder: Surgically removed Common bile duct: Diameter: 5 mm Liver: Mild heterogeneity is seen. Mild nodularity is noted. These changes are consistent with underlying cirrhosis. No focal mass is noted. Portal vein is patent on color Doppler imaging with normal direction of blood flow towards the liver. Other: None. IMPRESSION: Changes of hepatic cirrhosis. Status post cholecystectomy. Electronically Signed   By: Alcide CleverMark  Lukens M.D.   On: 03/12/2019 11:11    Microbiology Recent Results (from the past 240 hour(s))  Culture, respiratory (non-expectorated)     Status: None   Collection Time: 03/19/19  1:56 PM   Specimen: Tracheal Aspirate; Respiratory  Result Value Ref Range Status   Specimen Description   Final    TRACHEAL ASPIRATE Performed at [facility], [address] [place], [place] [address]    Special Requests   Final    NONE Performed at [facility], [address] [place], [place] [address]    Gram Stain   Final    MODERATE WBC PRESENT,BOTH PMN AND MONONUCLEAR NO ORGANISMS SEEN Performed at [facility], [address] [place], [place] [address]    Culture FEW CANDIDA ALBICANS  Final   Report Status 03/21/2019 FINAL  Final  Culture, Urine     Status: None   Collection Time: 03/19/19  1:56 PM    Specimen: Urine, Catheterized  Result Value Ref Range Status   Specimen Description   Final    URINE, CATHETERIZED Performed at [facility], [address] [place], [place] [address]    Special Requests   Final    NONE Performed at [facility], [address] [place], [place] [address]    Culture   Final    NO GROWTH Performed at [facility], [address] [place], [place] [address]    Report Status 03/20/2019 FINAL  Final  Culture, blood (routine x 2)     Status: None   Collection Time: 03/19/19  2:26 PM   Specimen: BLOOD  Result Value Ref Range Status   Specimen Description   Final    BLOOD BLOOD RIGHT HAND Performed at [facility], [address] [place], [place] [address]    Special Requests   Final    BOTTLES DRAWN AEROBIC AND ANAEROBIC Blood Culture adequate volume Performed at [facility], [address] [place], [place] [address]    Culture   Final    NO GROWTH 5 DAYS Performed at [facility], [address] [place], [place] [address]    Report Status 03/24/2019 FINAL  Final  Culture, blood (routine x 2)     Status: None   Collection Time: 03/19/19  2:28 PM   Specimen: BLOOD  Result Value Ref Range Status   Specimen Description   Final    BLOOD BLOOD RIGHT HAND  Performed at [facility], [address] [place], [place] [address]    Special Requests   Final    BOTTLES DRAWN AEROBIC AND ANAEROBIC Blood Culture adequate volume Performed at [facility], [address] [place], [place] [address]    Culture   Final    NO GROWTH 5 DAYS Performed at [facility], [address] [place], [place] [address]    Report Status 03/24/2019 FINAL  Final  MRSA PCR Screening     Status: None   Collection Time: 03/20/19  4:00 PM   Specimen: Nasal Mucosa; Nasopharyngeal  Result Value Ref Range Status   MRSA by PCR NEGATIVE  NEGATIVE Final    Comment:        The GeneXpert MRSA Assay (FDA approved for NASAL specimens only), is one component of a comprehensive MRSA colonization surveillance program. It is not intended to diagnose MRSA infection nor to guide or monitor treatment for MRSA infections. Performed at [facility], [address] [place], [place] [address]     Lab Basic Metabolic Panel: Recent Labs  Lab 03/22/19 0447 03/23/19 0515 03/24/19 0310 03/25/19 0409 04/10/2019 0359  NA 147* 148* 147* 142 141  K 4.8 5.1 4.8 4.9 4.8  CL 117* 117* 115* 113* 110  CO2 20* 22 18* 16* 17*  GLUCOSE 113* 138* 135* 454* 324*  BUN 123* 133* 142* 145* 167*  CREATININE 3.14* 3.62* 3.82* 3.78* 4.15*  CALCIUM 7.2* 7.7* 8.0* 7.1* 7.9*  MG 2.3 2.5*  --   --   --   PHOS 5.7*  --   --   --   --    Liver Function Tests: Recent Labs  Lab 03/25/19 0409 03/25/2019 0359  AST 50* 113*  ALT 61* 106*  ALKPHOS 93 100  BILITOT 0.5 0.6  PROT 5.1* 5.6*  ALBUMIN 1.9* 2.0*   No results for input(s): LIPASE, AMYLASE in the last 168 hours. Recent Labs  Lab 03/25/19 1205  AMMONIA 91*   CBC: Recent Labs  Lab 03/22/19 0447 03/23/19 0425 03/24/19 0310 03/25/19 0409 04/09/2019 0359  WBC 10.6* 9.6 15.6* 15.9* 16.5*  NEUTROABS 8.4* 7.4  --   --   --   HGB 8.8* 8.0* 10.6* 8.8* 8.5*  HCT 30.1* 28.1* 34.7* 29.3* 28.3*  MCV 107.1* 109.3* 102.1* 104.6* 104.0*  PLT 91* 71* 109* 100* 94*   Cardiac Enzymes: No results for input(s): CKTOTAL, CKMB, CKMBINDEX, TROPONINI in the last 168 hours. Sepsis Labs: Recent Labs  Lab 03/23/19 0425 03/24/19 0310 03/25/19 0409 04/21/2019 0359  WBC 9.6 15.6* 15.9* 16.5*    Procedures/Operations     Everlie Eble 03/28/2019, 1:14 PM

## 2019-04-24 DEATH — deceased

## 2020-07-11 IMAGING — DX PORTABLE CHEST - 1 VIEW
1 series · 1 of 1 positions shown · non-contrast
Comparison: 03/16/2019

CLINICAL DATA: Respiratory failure

EXAM:
PORTABLE CHEST 1 VIEW

[chest ap]
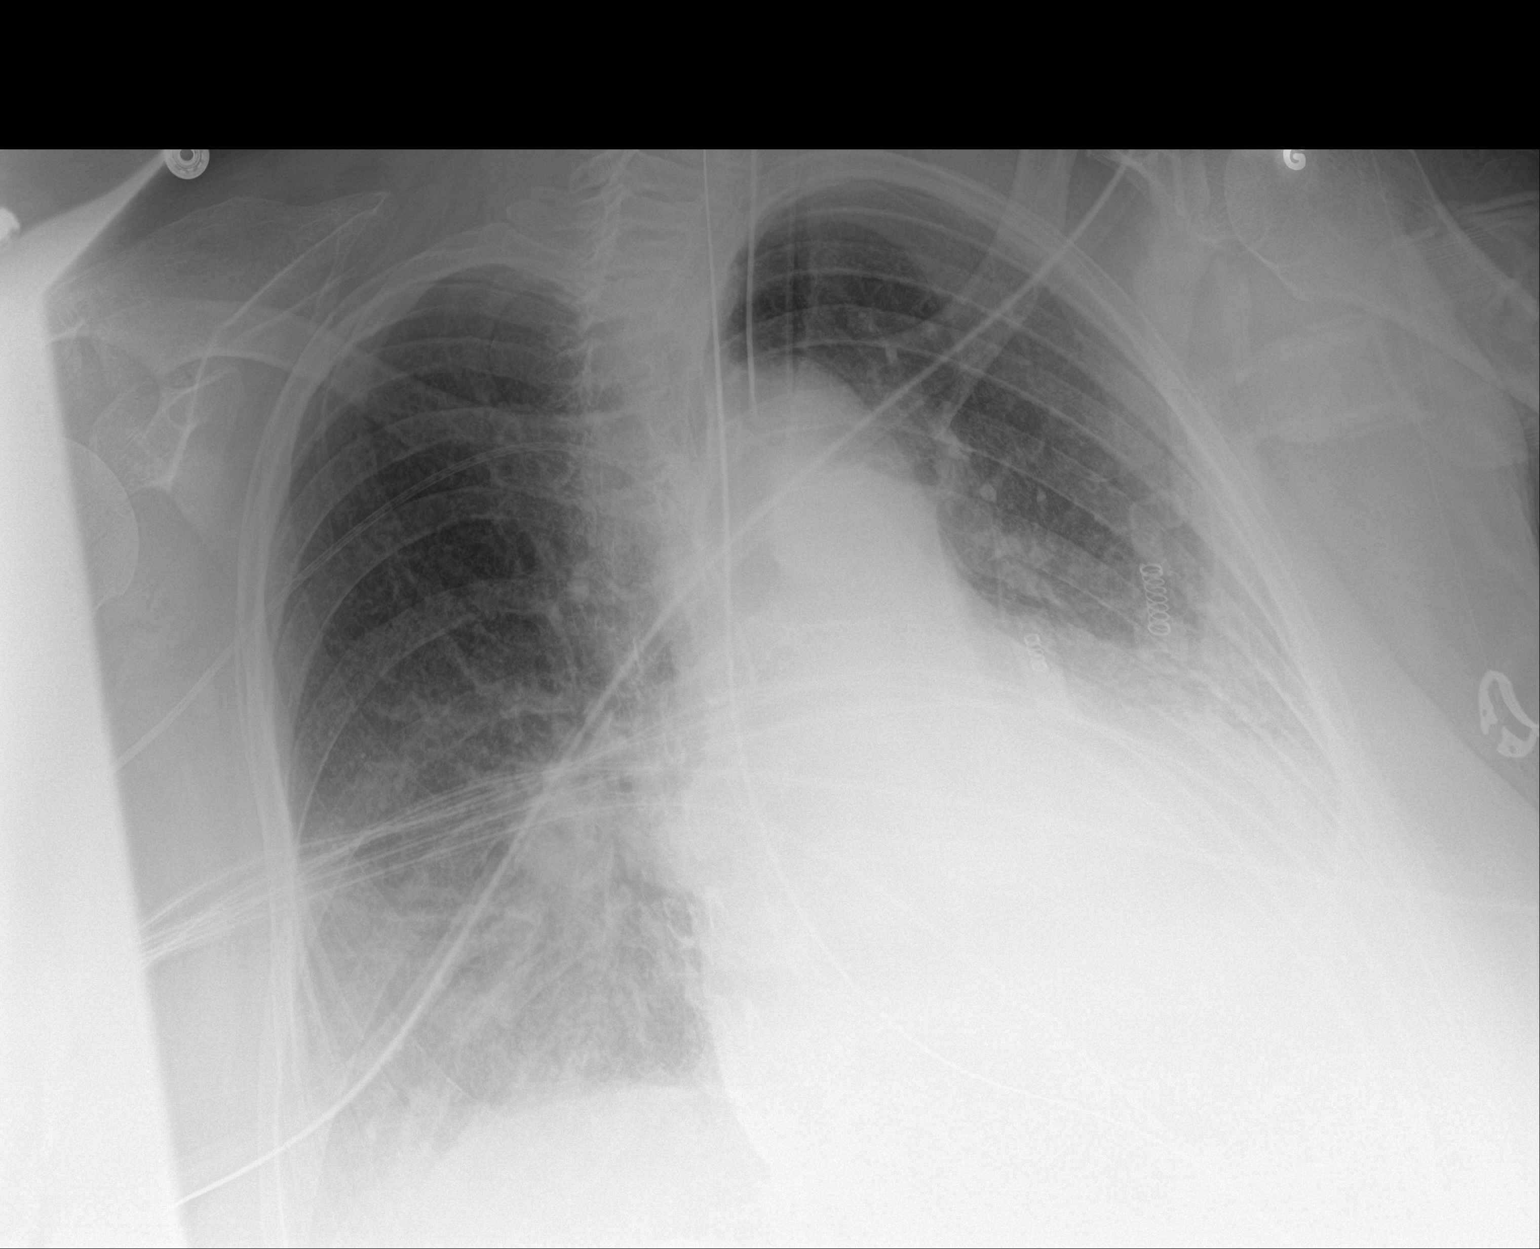

[1 of 1 positions shown; findings below may reference images not displayed]

FINDINGS: Endotracheal tube in good position. NG tube enters the stomach with
the tip not visualized.

Worsening bilateral airspace disease mostly in the bases. Small
pleural effusions. Diffuse vascular congestion.

Right arm PICC tip in the SVC.
IMPRESSION: Worsening lung aeration bilaterally with increased bilateral
airspace disease. Favor edema over pneumonia.

Endotracheal tube remains in good position. Right arm PICC tip in
the SVC.

## 2020-07-12 IMAGING — DX PORTABLE CHEST - 1 VIEW
1 series · 1 of 1 positions shown · non-contrast
Comparison: Yesterday

CLINICAL DATA: Respiratory failure

EXAM:
PORTABLE CHEST 1 VIEW

[chest ap]
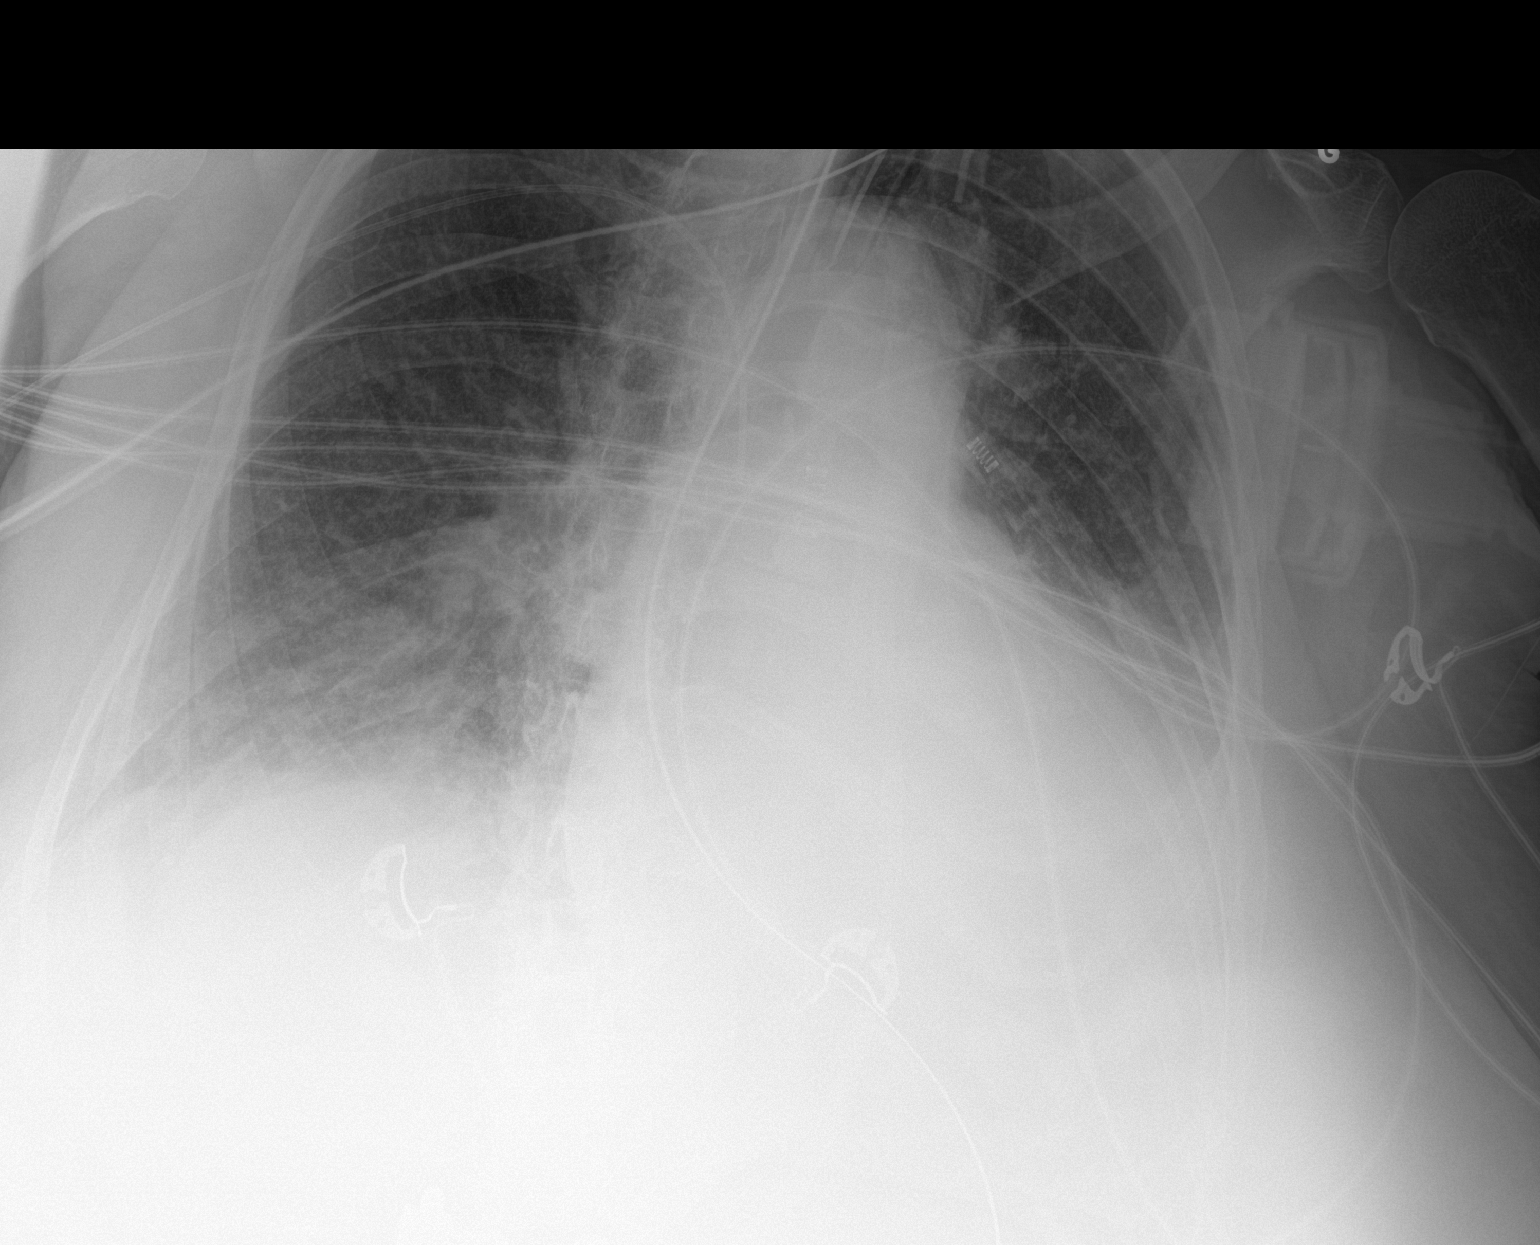

[1 of 1 positions shown; findings below may reference images not displayed]

FINDINGS: Endotracheal tube tip at the clavicular heads. The orogastric tube
at least reaches the stomach. Right PICC with tip at the SVC.

Cardiomegaly. Indistinct opacity at both lung bases. No definite
effusion. No pneumothorax.
IMPRESSION: Stable hardware positioning and bilateral lower lobe opacification.

## 2020-07-13 IMAGING — DX PORTABLE CHEST - 1 VIEW
1 series · 1 of 1 positions shown · non-contrast
Comparison: Radiograph March 20, 2019.

CLINICAL DATA: Respiratory failure.

EXAM:
PORTABLE CHEST 1 VIEW

[chest ap]
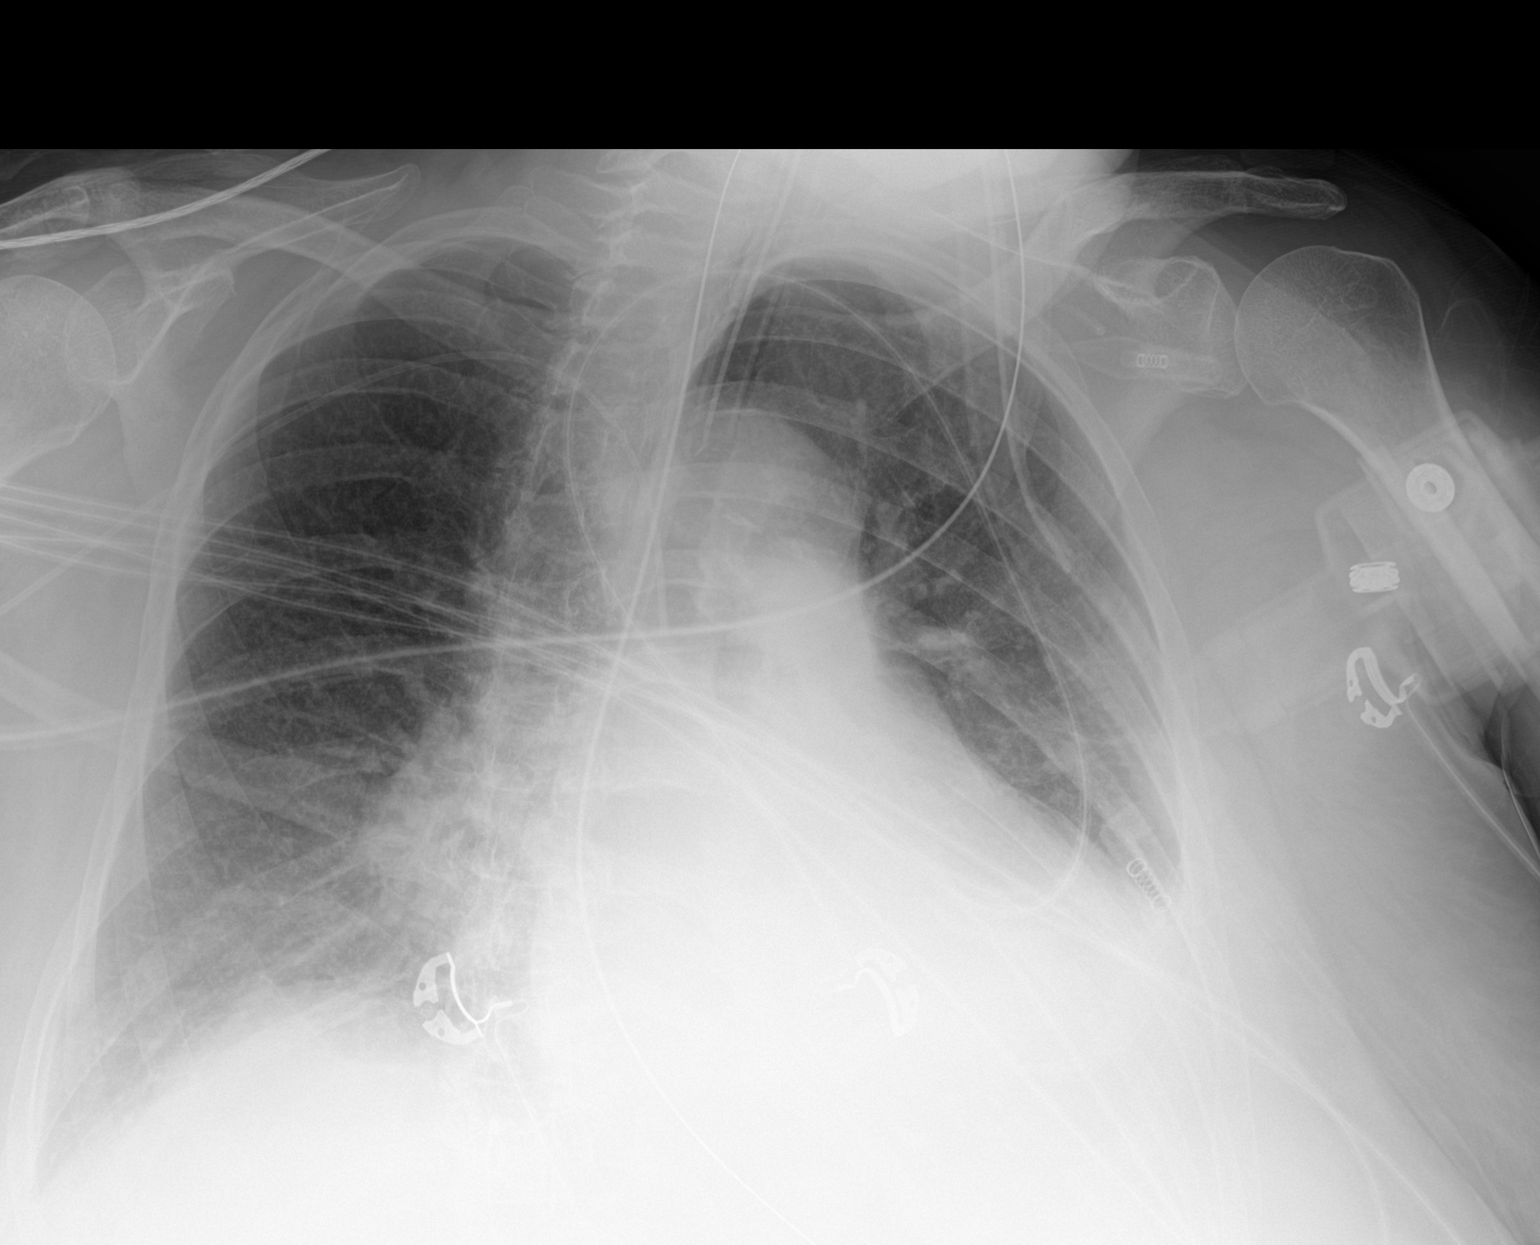

[1 of 1 positions shown; findings below may reference images not displayed]

FINDINGS: Stable cardiomegaly. Endotracheal and nasogastric tubes are
unchanged in position. No pneumothorax is noted. Right-sided PICC
line is unchanged. Stable bibasilar subsegmental atelectasis or
edema is noted. Small pleural effusions cannot be excluded. Bony
thorax is unremarkable.
IMPRESSION: Stable support apparatus. Stable bibasilar subsegmental atelectasis
or edema is noted.

## 2020-07-14 IMAGING — DX PORTABLE CHEST - 1 VIEW
2 series · 2 of 2 positions shown · non-contrast
Comparison: 03/21/2019

CLINICAL DATA: Respiratory failure

EXAM:
PORTABLE CHEST 1 VIEW

[chest ap (1 of 2)]
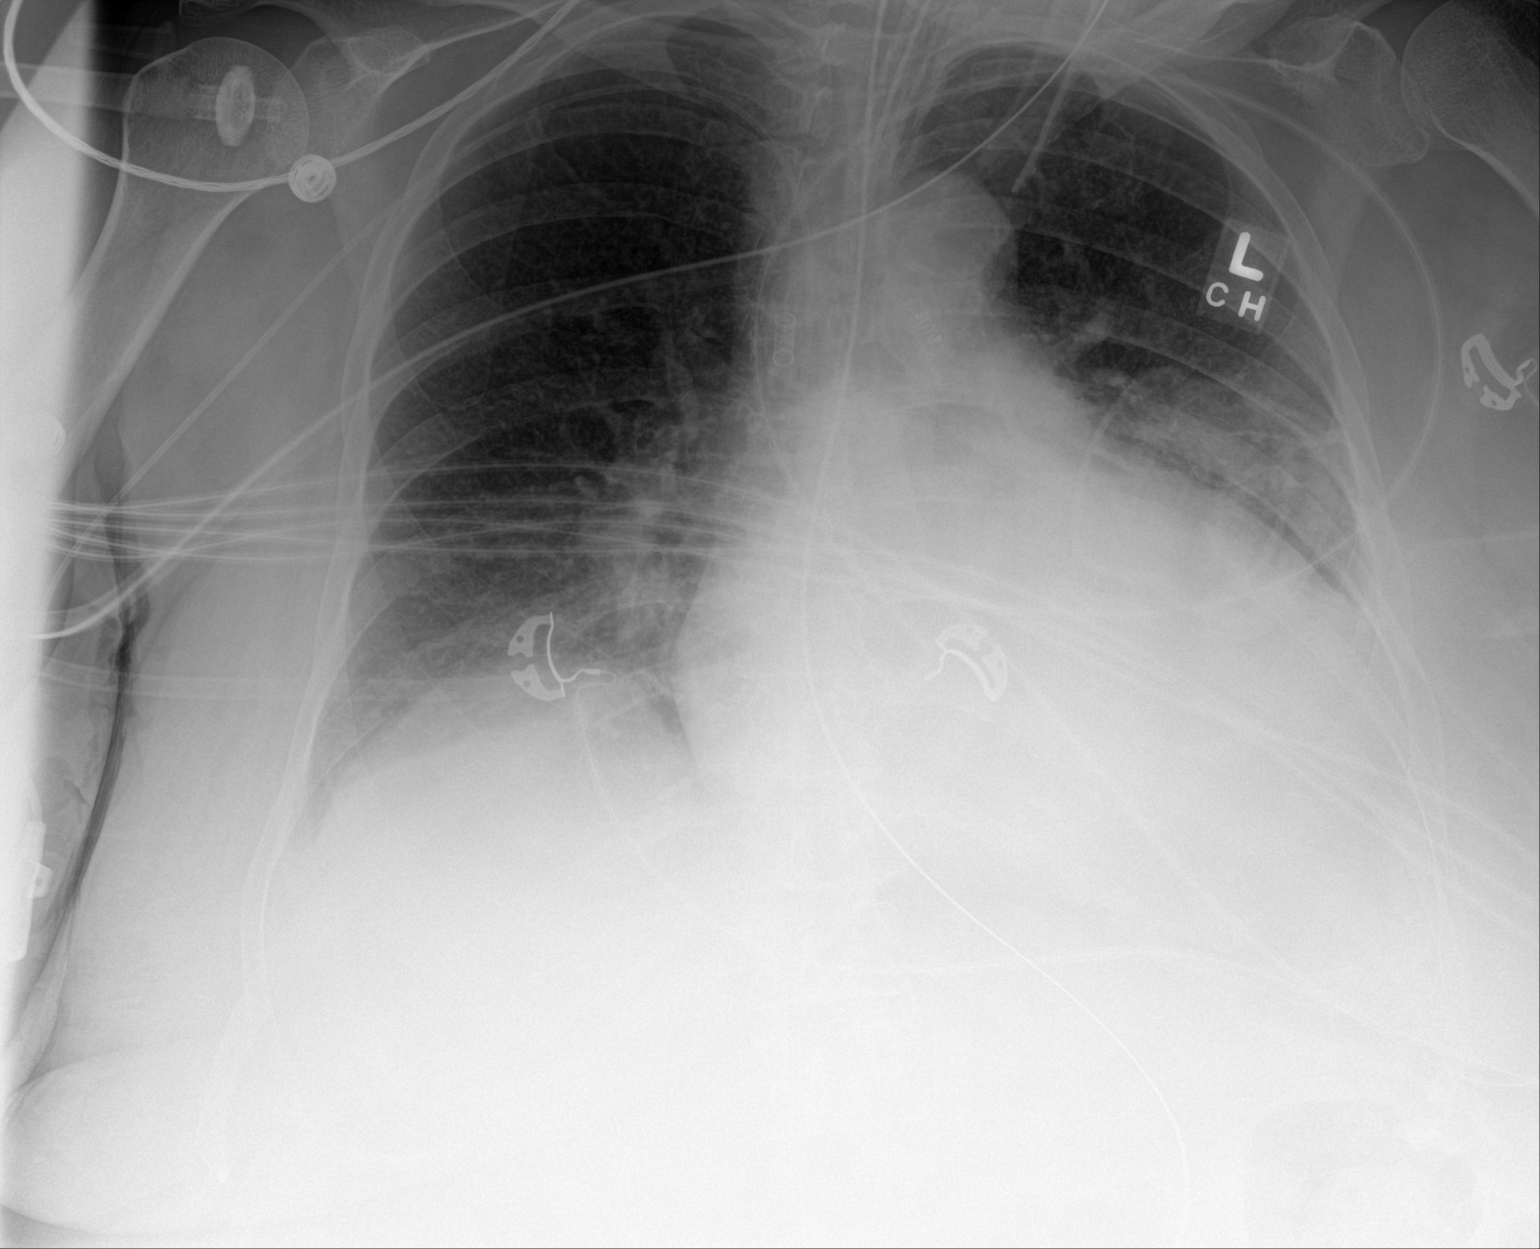

[chest ap (2 of 2)]
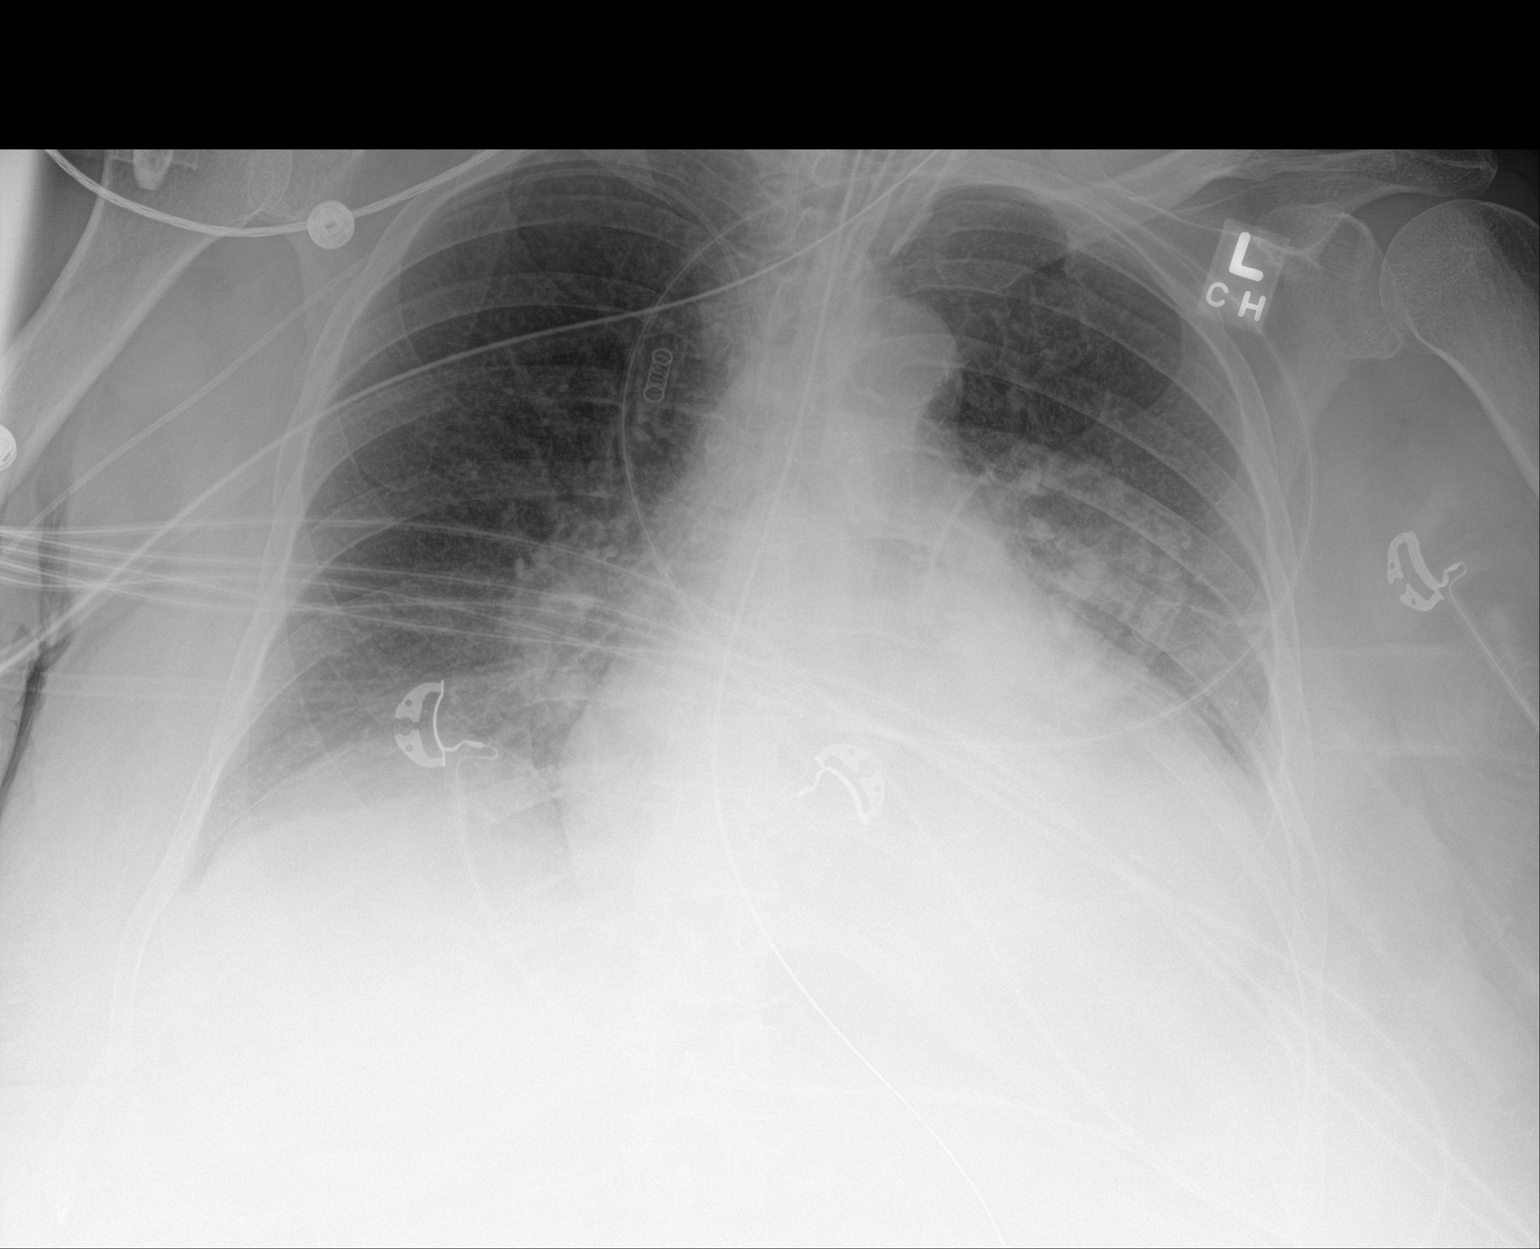

[2 of 2 positions shown; findings below may reference images not displayed]

FINDINGS: Endotracheal tube tip is at the level of the clavicular heads.
Orogastric tube side port is below the field of view. Mild
cardiomegaly is unchanged. Consolidation of the retrocardiac left
lung base is unchanged.
IMPRESSION: Endotracheal tube tip at the level of the clavicular heads.

Unchanged left basilar consolidation.

## 2021-04-23 DEATH — deceased
